# Patient Record
Sex: Male | Born: 1950
Health system: Southern US, Community
[De-identification: ages and names within clinical notes are randomized; demographics above are authoritative.]

## PROBLEM LIST (undated history)

## (undated) DIAGNOSIS — Z89012 Acquired absence of left thumb: Principal | ICD-10-CM

## (undated) DIAGNOSIS — K219 Gastro-esophageal reflux disease without esophagitis: Secondary | ICD-10-CM

## (undated) DIAGNOSIS — H5461 Unqualified visual loss, right eye, normal vision left eye: Secondary | ICD-10-CM

## (undated) DIAGNOSIS — Z87828 Personal history of other (healed) physical injury and trauma: Secondary | ICD-10-CM

## (undated) DIAGNOSIS — J302 Other seasonal allergic rhinitis: Secondary | ICD-10-CM

## (undated) DIAGNOSIS — R32 Unspecified urinary incontinence: Secondary | ICD-10-CM

## (undated) DIAGNOSIS — C801 Malignant (primary) neoplasm, unspecified: Secondary | ICD-10-CM

## (undated) DIAGNOSIS — R233 Spontaneous ecchymoses: Secondary | ICD-10-CM

## (undated) DIAGNOSIS — L570 Actinic keratosis: Secondary | ICD-10-CM

## (undated) DIAGNOSIS — R238 Other skin changes: Secondary | ICD-10-CM

## (undated) DIAGNOSIS — H53451 Other localized visual field defect, right eye: Secondary | ICD-10-CM

## (undated) DIAGNOSIS — E785 Hyperlipidemia, unspecified: Secondary | ICD-10-CM

## (undated) DIAGNOSIS — Z95818 Presence of other cardiac implants and grafts: Secondary | ICD-10-CM

## (undated) DIAGNOSIS — N200 Calculus of kidney: Secondary | ICD-10-CM

## (undated) DIAGNOSIS — I4891 Unspecified atrial fibrillation: Secondary | ICD-10-CM

## (undated) DIAGNOSIS — J181 Lobar pneumonia, unspecified organism: Principal | ICD-10-CM

## (undated) HISTORY — DX: Actinic keratosis: L57.0

## (undated) HISTORY — PX: HERNIA REPAIR: SHX51

## (undated) HISTORY — DX: Lobar pneumonia, unspecified organism: J18.1

## (undated) HISTORY — PX: BRAIN SURGERY: SHX531

## (undated) HISTORY — DX: Other skin changes: R23.8

## (undated) HISTORY — DX: Malignant (primary) neoplasm, unspecified: C80.1

## (undated) HISTORY — DX: Spontaneous ecchymoses: R23.3

## (undated) HISTORY — DX: Acquired absence of left thumb: Z89.012

## (undated) HISTORY — DX: Hyperlipidemia, unspecified: E78.5

## (undated) HISTORY — DX: Presence of other cardiac implants and grafts: Z95.818

## (undated) HISTORY — PX: CARPAL TUNNEL RELEASE: SHX101

## (undated) HISTORY — PX: PROSTATE SURGERY: SHX751

---

## 1998-04-03 ENCOUNTER — Ambulatory Visit (HOSPITAL_BASED_OUTPATIENT_CLINIC_OR_DEPARTMENT_OTHER): Admission: RE | Admit: 1998-04-03 | Discharge: 1998-04-03 | Payer: Self-pay | Admitting: General Surgery

## 1999-10-08 ENCOUNTER — Other Ambulatory Visit: Admission: RE | Admit: 1999-10-08 | Discharge: 1999-10-08 | Payer: Self-pay

## 2000-07-28 ENCOUNTER — Ambulatory Visit (HOSPITAL_BASED_OUTPATIENT_CLINIC_OR_DEPARTMENT_OTHER): Admission: RE | Admit: 2000-07-28 | Discharge: 2000-07-28 | Payer: Self-pay | Admitting: Orthopedic Surgery

## 2001-04-04 ENCOUNTER — Encounter: Payer: Self-pay | Admitting: Urology

## 2001-04-09 ENCOUNTER — Inpatient Hospital Stay (HOSPITAL_COMMUNITY): Admission: RE | Admit: 2001-04-09 | Discharge: 2001-04-12 | Payer: Self-pay | Admitting: Urology

## 2002-03-28 DIAGNOSIS — C801 Malignant (primary) neoplasm, unspecified: Secondary | ICD-10-CM

## 2002-03-28 HISTORY — DX: Malignant (primary) neoplasm, unspecified: C80.1

## 2002-04-11 ENCOUNTER — Ambulatory Visit: Admission: RE | Admit: 2002-04-11 | Discharge: 2002-05-14 | Payer: Self-pay | Admitting: Radiation Oncology

## 2002-07-18 ENCOUNTER — Ambulatory Visit: Admission: RE | Admit: 2002-07-18 | Discharge: 2002-07-18 | Payer: Self-pay | Admitting: Radiation Oncology

## 2002-07-22 ENCOUNTER — Ambulatory Visit: Admission: RE | Admit: 2002-07-22 | Discharge: 2002-09-18 | Payer: Self-pay | Admitting: Radiation Oncology

## 2005-07-27 ENCOUNTER — Ambulatory Visit (HOSPITAL_COMMUNITY): Admission: AD | Admit: 2005-07-27 | Discharge: 2005-07-27 | Payer: Self-pay | Admitting: Urology

## 2006-01-31 ENCOUNTER — Emergency Department: Payer: Self-pay | Admitting: Emergency Medicine

## 2011-04-12 ENCOUNTER — Ambulatory Visit (INDEPENDENT_AMBULATORY_CARE_PROVIDER_SITE_OTHER): Payer: Self-pay | Admitting: General Surgery

## 2011-04-12 ENCOUNTER — Encounter (INDEPENDENT_AMBULATORY_CARE_PROVIDER_SITE_OTHER): Payer: Self-pay | Admitting: General Surgery

## 2011-04-12 VITALS — BP 154/90 | HR 67 | Temp 97.7°F | Ht 67.0 in | Wt 192.2 lb

## 2011-04-12 DIAGNOSIS — K4090 Unilateral inguinal hernia, without obstruction or gangrene, not specified as recurrent: Secondary | ICD-10-CM

## 2011-04-12 NOTE — Progress Notes (Signed)
Patient ID: Michael Cantrell, male   DOB: Feb 06, 1951, 61 y.o.   MRN: [id number]  Chief Complaint  Patient presents with  . Pre-op Exam    eval hernia    HPI Michael Cantrell is a 61 y.o. male.  Self referred HPI 17 yom who presents with right groin bulge and discomfort over last 4 weeks.  He noticed this after doing some lifting.  He works as a Archivist.  He has a prior history of a LIH repair with mesh by Dr. Maryagnes Cantrell.  He has no n/v, issues with bowel movements.  This is worse with standing during the day and always goes away at night.  He comes in today to discuss repair.  Past Medical History  Diagnosis Date  . Hyperlipidemia   . Nasal congestion   . Bruises easily     Past Surgical History  Procedure Date  . Hernia repair     LIH with mesh 2000  . Prostate surgery     CaP treated with prostatectomy and xrt    History reviewed. No pertinent family history.  Social History History  Substance Use Topics  . Smoking status: Never Smoker   . Smokeless tobacco: Not on file  . Alcohol Use: Yes    Allergies  Allergen Reactions  . Ivp Dye (Iodinated Diagnostic Agents) Hives    Current Outpatient Prescriptions  Medication Sig Dispense Refill  . famotidine (PEPCID) 10 MG tablet Take 10 mg by mouth as needed.      [name] ibuprofen (ADVIL,MOTRIN) 200 MG tablet Take 200 mg by mouth as needed.      . Loratadine (CLARITIN PO) Take 10 mg by mouth as needed.        Review of Systems Review of Systems  Constitutional: Negative for fever, chills and unexpected weight change.  HENT: Negative for hearing loss, congestion, sore throat, trouble swallowing and voice change.   Eyes: Negative for visual disturbance.  Respiratory: Negative for cough and wheezing.   Cardiovascular: Negative for chest pain, palpitations and leg swelling.  Gastrointestinal: Negative for nausea, vomiting, abdominal pain, diarrhea, constipation, blood in stool, abdominal distention, anal bleeding and rectal pain.    Genitourinary: Negative for hematuria and difficulty urinating.  Musculoskeletal: Negative for arthralgias.  Skin: Negative for rash and wound.  Neurological: Negative for seizures, syncope, weakness and headaches.  Hematological: Negative for adenopathy. Does not bruise/bleed easily.  Psychiatric/Behavioral: Negative for confusion.    Blood pressure 154/90, pulse 67, temperature 97.7 F (36.5 C), temperature source Temporal, height 5\' 7"  (1.702 m), weight 192 lb 3.2 oz (87.181 kg), SpO2 97.00%.  Physical Exam Physical Exam  Constitutional: He appears well-developed and well-nourished.  Neck: Neck supple.  Cardiovascular: Normal rate, regular rhythm and normal heart sounds.   Pulmonary/Chest: Effort normal and breath sounds normal. He has no wheezes. He has no rales.  Abdominal: Soft. Bowel sounds are normal. He exhibits no mass. There is no tenderness. A hernia is present. Hernia confirmed positive in the right inguinal area. Hernia confirmed negative in the left inguinal area.  Genitourinary: Testes normal and penis normal.     Lymphadenopathy:    He has no cervical adenopathy.       Right: No inguinal adenopathy present.       Left: No inguinal adenopathy present.      Assessment    RIH    Plan    We discussed observation versus repair.  We discussed both laparoscopic and open inguinal hernia repairs. I  described the procedure in detail.  The patient was given educational material.  Goals should be achieved with surgery. We discussed the usage of mesh and the rationale behind that. We went over the pathophysiology of inguinal hernia. We have elected to perform open inguinal hernia repair with mesh.  We discussed the risks including bleeding, infection, recurrence, postoperative pain and chronic groin pain, testicular injury, urinary retention, numbness in groin and around incision.         Michael Cantrell 04/12/2011, 9:35 AM

## 2011-04-12 NOTE — Patient Instructions (Signed)
Educational material provided.

## 2011-04-15 ENCOUNTER — Encounter (HOSPITAL_BASED_OUTPATIENT_CLINIC_OR_DEPARTMENT_OTHER): Payer: Self-pay | Admitting: *Deleted

## 2011-04-15 NOTE — Pre-Procedure Instructions (Signed)
To come for CBC, BMET, EKG, CXR

## 2011-04-19 ENCOUNTER — Encounter (HOSPITAL_BASED_OUTPATIENT_CLINIC_OR_DEPARTMENT_OTHER)
Admission: RE | Admit: 2011-04-19 | Discharge: 2011-04-19 | Disposition: A | Discharge status: Home / Self Care | Payer: Self-pay | Source: Ambulatory Visit | Attending: General Surgery | Admitting: General Surgery

## 2011-04-19 ENCOUNTER — Other Ambulatory Visit: Payer: Self-pay

## 2011-04-19 ENCOUNTER — Ambulatory Visit
Admission: RE | Admit: 2011-04-19 | Discharge: 2011-04-19 | Disposition: A | Discharge status: Home / Self Care | Payer: No Typology Code available for payment source | Source: Ambulatory Visit | Attending: General Surgery | Admitting: General Surgery

## 2011-04-19 LAB — CBC
MCH: 31.6 pg (ref 26.0–34.0)
MCHC: 34.4 g/dL (ref 30.0–36.0)
Platelets: 153 10*3/uL (ref 150–400)
RDW: 13.5 % (ref 11.5–15.5)

## 2011-04-19 LAB — BASIC METABOLIC PANEL
BUN: 15 mg/dL (ref 6–23)
Calcium: 9.5 mg/dL (ref 8.4–10.5)
GFR calc non Af Amer: >90 mL/min (ref >90)
Glucose, Bld: 96 mg/dL (ref 70–99)
Sodium: 139 mEq/L (ref 135–145)

## 2011-04-20 ENCOUNTER — Encounter (HOSPITAL_BASED_OUTPATIENT_CLINIC_OR_DEPARTMENT_OTHER): Payer: Self-pay | Admitting: *Deleted

## 2011-04-20 ENCOUNTER — Ambulatory Visit (HOSPITAL_BASED_OUTPATIENT_CLINIC_OR_DEPARTMENT_OTHER): Payer: Self-pay | Admitting: Anesthesiology

## 2011-04-20 ENCOUNTER — Encounter (HOSPITAL_BASED_OUTPATIENT_CLINIC_OR_DEPARTMENT_OTHER): Payer: Self-pay | Admitting: Anesthesiology

## 2011-04-20 ENCOUNTER — Ambulatory Visit (HOSPITAL_BASED_OUTPATIENT_CLINIC_OR_DEPARTMENT_OTHER)
Admission: RE | Admit: 2011-04-20 | Discharge: 2011-04-20 | Disposition: A | Discharge status: Home / Self Care | Payer: Self-pay | Source: Ambulatory Visit | Attending: General Surgery | Admitting: General Surgery

## 2011-04-20 ENCOUNTER — Encounter (HOSPITAL_BASED_OUTPATIENT_CLINIC_OR_DEPARTMENT_OTHER)
Admission: RE | Disposition: A | Discharge status: Home / Self Care | Payer: Self-pay | Source: Ambulatory Visit | Attending: General Surgery

## 2011-04-20 DIAGNOSIS — K4090 Unilateral inguinal hernia, without obstruction or gangrene, not specified as recurrent: Secondary | ICD-10-CM | POA: Insufficient documentation

## 2011-04-20 HISTORY — DX: Gastro-esophageal reflux disease without esophagitis: K21.9

## 2011-04-20 HISTORY — DX: Unspecified urinary incontinence: R32

## 2011-04-20 HISTORY — DX: Unqualified visual loss, right eye, normal vision left eye: H54.61

## 2011-04-20 HISTORY — DX: Other seasonal allergic rhinitis: J30.2

## 2011-04-20 HISTORY — DX: Calculus of kidney: N20.0

## 2011-04-20 HISTORY — DX: Personal history of other (healed) physical injury and trauma: Z87.828

## 2011-04-20 HISTORY — PX: INGUINAL HERNIA REPAIR: SHX194

## 2011-04-20 SURGERY — REPAIR, HERNIA, INGUINAL, ADULT
Anesthesia: General | Laterality: Right | Wound class: Clean

## 2011-04-20 MED ORDER — BUPIVACAINE HCL (PF) 0.25 % IJ SOLN
INTRAMUSCULAR | Status: DC | PRN
  Administered 2011-04-20: 5 mL

## 2011-04-20 MED ORDER — OXYCODONE-ACETAMINOPHEN 5-325 MG PO TABS: 1.0000 | ORAL_TABLET | Freq: Four times a day (QID) | ORAL | Status: AC | PRN

## 2011-04-20 MED ORDER — FENTANYL CITRATE 0.05 MG/ML IJ SOLN
50.0000 ug | INTRAMUSCULAR | Status: DC | PRN
  Administered 2011-04-20: 100 ug via INTRAVENOUS

## 2011-04-20 MED ORDER — LIDOCAINE HCL (PF) 1 % IJ SOLN
INTRAMUSCULAR | Status: DC | PRN
  Administered 2011-04-20: 2 mL

## 2011-04-20 MED ORDER — MIDAZOLAM HCL 2 MG/2ML IJ SOLN
0.5000 mg | INTRAMUSCULAR | Status: DC | PRN
  Administered 2011-04-20: 2 mg via INTRAVENOUS

## 2011-04-20 MED ORDER — FENTANYL CITRATE 0.05 MG/ML IJ SOLN: 25.0000 ug | INTRAMUSCULAR | Status: DC | PRN

## 2011-04-20 MED ORDER — METOCLOPRAMIDE HCL 5 MG/ML IJ SOLN: 10.0000 mg | Freq: Once | INTRAMUSCULAR | Status: DC | PRN

## 2011-04-20 MED ORDER — BUPIVACAINE-EPINEPHRINE PF 0.5-1:200000 % IJ SOLN
INTRAMUSCULAR | Status: DC | PRN
  Administered 2011-04-20: 25 mL

## 2011-04-20 MED ORDER — DROPERIDOL 2.5 MG/ML IJ SOLN
INTRAMUSCULAR | Status: DC | PRN
  Administered 2011-04-20: 0.625 mg via INTRAVENOUS

## 2011-04-20 MED ORDER — MORPHINE SULFATE 10 MG/ML IJ SOLN: 0.0500 mg/kg | INTRAMUSCULAR | Status: DC | PRN

## 2011-04-20 MED ORDER — PROPOFOL 10 MG/ML IV EMUL
INTRAVENOUS | Status: DC | PRN
  Administered 2011-04-20: 200 mg via INTRAVENOUS

## 2011-04-20 MED ORDER — OXYCODONE HCL 5 MG PO TABS
5.0000 mg | ORAL_TABLET | Freq: Once | ORAL | Status: AC
  Administered 2011-04-20: 5 mg via ORAL

## 2011-04-20 MED ORDER — DEXAMETHASONE SODIUM PHOSPHATE 4 MG/ML IJ SOLN
INTRAMUSCULAR | Status: DC | PRN
  Administered 2011-04-20: 10 mg via INTRAVENOUS

## 2011-04-20 MED ORDER — ACETAMINOPHEN 10 MG/ML IV SOLN
1000.0000 mg | Freq: Once | INTRAVENOUS | Status: AC
  Administered 2011-04-20: 1000 mg via INTRAVENOUS

## 2011-04-20 MED ORDER — CEFAZOLIN SODIUM-DEXTROSE 2-3 GM-% IV SOLR
2.0000 g | INTRAVENOUS | Status: AC
  Administered 2011-04-20: 2 g via INTRAVENOUS

## 2011-04-20 MED ORDER — HYDROMORPHONE HCL PF 1 MG/ML IJ SOLN
0.5000 mg | INTRAMUSCULAR | Status: DC | PRN
  Administered 2011-04-20 (×2): 0.25 mg via INTRAVENOUS

## 2011-04-20 MED ORDER — LACTATED RINGERS IV SOLN
INTRAVENOUS | Status: DC
  Administered 2011-04-20 (×3): via INTRAVENOUS

## 2011-04-20 MED ORDER — FENTANYL CITRATE 0.05 MG/ML IJ SOLN
INTRAMUSCULAR | Status: DC | PRN
  Administered 2011-04-20: 50 ug via INTRAVENOUS
  Administered 2011-04-20 (×2): 25 ug via INTRAVENOUS
  Administered 2011-04-20: 50 ug via INTRAVENOUS

## 2011-04-20 MED ORDER — ONDANSETRON HCL 4 MG/2ML IJ SOLN
INTRAMUSCULAR | Status: DC | PRN
  Administered 2011-04-20: 4 mg via INTRAVENOUS

## 2011-04-20 SURGICAL SUPPLY — 49 items
ADH SKN CLS APL DERMABOND .7 (GAUZE/BANDAGES/DRESSINGS) ×1
BLADE SURG 15 STRL LF DISP TIS (BLADE) ×1 IMPLANT
BLADE SURG 15 STRL SS (BLADE) ×1
BLADE SURG ROTATE 9660 (MISCELLANEOUS) IMPLANT
CHLORAPREP W/TINT 26ML (MISCELLANEOUS) ×2 IMPLANT
CLOTH BEACON ORANGE TIMEOUT ST (SAFETY) ×2 IMPLANT
COVER MAYO STAND STRL (DRAPES) ×2 IMPLANT
COVER TABLE BACK 60X90 (DRAPES) ×2 IMPLANT
DECANTER SPIKE VIAL GLASS SM (MISCELLANEOUS) IMPLANT
DERMABOND ADVANCED (GAUZE/BANDAGES/DRESSINGS) ×1
DERMABOND ADVANCED .7 DNX12 (GAUZE/BANDAGES/DRESSINGS) ×1 IMPLANT
DRAIN PENROSE 1/2X12 LTX STRL (WOUND CARE) ×2 IMPLANT
DRAPE PED LAPAROTOMY (DRAPES) ×2 IMPLANT
ELECT COATED BLADE 2.86 ST (ELECTRODE) ×2 IMPLANT
ELECT REM PT RETURN 9FT ADLT (ELECTROSURGICAL) ×2
ELECTRODE REM PT RTRN 9FT ADLT (ELECTROSURGICAL) ×1 IMPLANT
GLOVE BIO SURGEON STRL SZ 6.5 (GLOVE) ×2 IMPLANT
GLOVE BIO SURGEON STRL SZ7 (GLOVE) ×4 IMPLANT
GLOVE BIOGEL M STRL SZ7.5 (GLOVE) ×2 IMPLANT
GLOVE BIOGEL PI IND STRL 7.0 (GLOVE) ×1 IMPLANT
GLOVE BIOGEL PI IND STRL 7.5 (GLOVE) ×1 IMPLANT
GLOVE BIOGEL PI IND STRL 8 (GLOVE) ×1 IMPLANT
GLOVE BIOGEL PI INDICATOR 7.0 (GLOVE) ×1
GLOVE BIOGEL PI INDICATOR 7.5 (GLOVE) ×1
GLOVE BIOGEL PI INDICATOR 8 (GLOVE) ×1
GOWN PREVENTION PLUS XLARGE (GOWN DISPOSABLE) ×4 IMPLANT
GOWN PREVENTION PLUS XXLARGE (GOWN DISPOSABLE) ×2 IMPLANT
MESH ULTRAPRO 3X6 7.6X15CM (Mesh General) ×2 IMPLANT
NEEDLE HYPO 22GX1.5 SAFETY (NEEDLE) ×2 IMPLANT
NS IRRIG 1000ML POUR BTL (IV SOLUTION) IMPLANT
PACK BASIN DAY SURGERY FS (CUSTOM PROCEDURE TRAY) ×2 IMPLANT
PENCIL BUTTON HOLSTER BLD 10FT (ELECTRODE) ×2 IMPLANT
SLEEVE SCD COMPRESS KNEE MED (MISCELLANEOUS) ×2 IMPLANT
SPONGE LAP 4X18 X RAY DECT (DISPOSABLE) ×2 IMPLANT
STRIP CLOSURE SKIN 1/2X4 (GAUZE/BANDAGES/DRESSINGS) IMPLANT
SUT MNCRL AB 4-0 PS2 18 (SUTURE) ×2 IMPLANT
SUT PROLENE 2 0 CT2 30 (SUTURE) ×6 IMPLANT
SUT PROLENE 2 0 SH DA (SUTURE) ×2 IMPLANT
SUT SILK 2 0 SH (SUTURE) IMPLANT
SUT VIC AB 0 SH 27 (SUTURE) IMPLANT
SUT VIC AB 2-0 SH 27 (SUTURE) ×4
SUT VIC AB 2-0 SH 27XBRD (SUTURE) ×2 IMPLANT
SUT VIC AB 3-0 SH 27 (SUTURE) ×2
SUT VIC AB 3-0 SH 27X BRD (SUTURE) ×1 IMPLANT
SUT VICRYL AB 3 0 TIES (SUTURE) ×4 IMPLANT
SYR CONTROL 10ML LL (SYRINGE) ×2 IMPLANT
TOWEL OR 17X24 6PK STRL BLUE (TOWEL DISPOSABLE) ×2 IMPLANT
TOWEL OR NON WOVEN STRL DISP B (DISPOSABLE) ×2 IMPLANT
WATER STERILE IRR 1000ML POUR (IV SOLUTION) IMPLANT

## 2011-04-20 NOTE — H&P (View-Only) (Signed)
Patient ID: Michael Cantrell, male   DOB: 08/13/1950, 61 y.o.   MRN: [id number]  Chief Complaint  Patient presents with  . Pre-op Exam    eval hernia    HPI Michael Cantrell is a 61 y.o. male.  Self referred HPI 60 yom who presents with right groin bulge and discomfort over last 4 weeks.  He noticed this after doing some lifting.  He works as a cabinet maker.  He has a prior history of a LIH repair with mesh by Dr. Leone.  He has no n/v, issues with bowel movements.  This is worse with standing during the day and always goes away at night.  He comes in today to discuss repair.  Past Medical History  Diagnosis Date  . Hyperlipidemia   . Nasal congestion   . Bruises easily     Past Surgical History  Procedure Date  . Hernia repair     LIH with mesh 2000  . Prostate surgery     CaP treated with prostatectomy and xrt    History reviewed. No pertinent family history.  Social History History  Substance Use Topics  . Smoking status: Never Smoker   . Smokeless tobacco: Not on file  . Alcohol Use: Yes    Allergies  Allergen Reactions  . Ivp Dye (Iodinated Diagnostic Agents) Hives    Current Outpatient Prescriptions  Medication Sig Dispense Refill  . famotidine (PEPCID) 10 MG tablet Take 10 mg by mouth as needed.      . ibuprofen (ADVIL,MOTRIN) 200 MG tablet Take 200 mg by mouth as needed.      . Loratadine (CLARITIN PO) Take 10 mg by mouth as needed.        Review of Systems Review of Systems  Constitutional: Negative for fever, chills and unexpected weight change.  HENT: Negative for hearing loss, congestion, sore throat, trouble swallowing and voice change.   Eyes: Negative for visual disturbance.  Respiratory: Negative for cough and wheezing.   Cardiovascular: Negative for chest pain, palpitations and leg swelling.  Gastrointestinal: Negative for nausea, vomiting, abdominal pain, diarrhea, constipation, blood in stool, abdominal distention, anal bleeding and rectal pain.    Genitourinary: Negative for hematuria and difficulty urinating.  Musculoskeletal: Negative for arthralgias.  Skin: Negative for rash and wound.  Neurological: Negative for seizures, syncope, weakness and headaches.  Hematological: Negative for adenopathy. Does not bruise/bleed easily.  Psychiatric/Behavioral: Negative for confusion.    Blood pressure 154/90, pulse 67, temperature 97.7 F (36.5 C), temperature source Temporal, height 5' 7" (1.702 m), weight 192 lb 3.2 oz (87.181 kg), SpO2 97.00%.  Physical Exam Physical Exam  Constitutional: He appears well-developed and well-nourished.  Neck: Neck supple.  Cardiovascular: Normal rate, regular rhythm and normal heart sounds.   Pulmonary/Chest: Effort normal and breath sounds normal. He has no wheezes. He has no rales.  Abdominal: Soft. Bowel sounds are normal. He exhibits no mass. There is no tenderness. A hernia is present. Hernia confirmed positive in the right inguinal area. Hernia confirmed negative in the left inguinal area.  Genitourinary: Testes normal and penis normal.     Lymphadenopathy:    He has no cervical adenopathy.       Right: No inguinal adenopathy present.       Left: No inguinal adenopathy present.      Assessment    RIH    Plan    We discussed observation versus repair.  We discussed both laparoscopic and open inguinal hernia repairs. I   described the procedure in detail.  The patient was given educational material.  Goals should be achieved with surgery. We discussed the usage of mesh and the rationale behind that. We went over the pathophysiology of inguinal hernia. We have elected to perform open inguinal hernia repair with mesh.  We discussed the risks including bleeding, infection, recurrence, postoperative pain and chronic groin pain, testicular injury, urinary retention, numbness in groin and around incision.         Mashayla Lavin 04/12/2011, 9:35 AM    

## 2011-04-20 NOTE — Anesthesia Preprocedure Evaluation (Signed)
Anesthesia Evaluation  Patient identified by MRN, date of birth, ID band Patient awake    Reviewed: Allergy & Precautions, H&P , NPO status , Patient's Chart, lab work & pertinent test results, reviewed documented beta blocker date and time   Airway Mallampati: II TM Distance: >3 FB Neck ROM: full    Dental   Pulmonary neg pulmonary ROS,          Cardiovascular neg cardio ROS     Neuro/Psych Negative Neurological ROS  Negative Psych ROS   GI/Hepatic negative GI ROS, Neg liver ROS,   Endo/Other  Negative Endocrine ROS  Renal/GU negative Renal ROS  Genitourinary negative   Musculoskeletal   Abdominal   Peds  Hematology negative hematology ROS (+)   Anesthesia Other Findings See surgeon's H&P   Reproductive/Obstetrics negative OB ROS                           Anesthesia Physical Anesthesia Plan  ASA: II  Anesthesia Plan: General   Post-op Pain Management: MAC Combined w/ Regional for Post-op pain   Induction: Intravenous  Airway Management Planned: LMA  Additional Equipment:   Intra-op Plan:   Post-operative Plan: Extubation in OR  Informed Consent: I have reviewed the patients History and Physical, chart, labs and discussed the procedure including the risks, benefits and alternatives for the proposed anesthesia with the patient or authorized representative who has indicated his/her understanding and acceptance.     Plan Discussed with: CRNA and Surgeon  Anesthesia Plan Comments:         Anesthesia Quick Evaluation  

## 2011-04-20 NOTE — Anesthesia Postprocedure Evaluation (Signed)
Anesthesia Post Note  Patient: Michael Cantrell  Procedure(s) Performed:  HERNIA REPAIR INGUINAL ADULT - right inguinal hernia with mesh   Anesthesia type: General  Patient location: PACU  Post pain: Pain level controlled  Post assessment: Patient's Cardiovascular Status Stable  Last Vitals:  Filed Vitals:   04/20/11 1701  BP:   Pulse: 67  Temp:   Resp: 17    Post vital signs: Reviewed and stable  Level of consciousness: alert  Complications: No apparent anesthesia complications

## 2011-04-20 NOTE — Transfer of Care (Signed)
Immediate Anesthesia Transfer of Care Note  Patient: Michael Cantrell  Procedure(s) Performed:  HERNIA REPAIR INGUINAL ADULT - right inguinal hernia with mesh   Patient Location: PACU  Anesthesia Type: General  Level of Consciousness: sedated  Airway & Oxygen Therapy: Patient Spontanous Breathing and Patient connected to face mask oxygen  Post-op Assessment: Report given to PACU RN and Post -op Vital signs reviewed and stable  Post vital signs: Reviewed and stable  Complications: No apparent anesthesia complications

## 2011-04-20 NOTE — Op Note (Signed)
Preoperative diagnosis: Right inguinal hernia Postoperative diagnosis: Right indirect inguinal hernia Surgeon: Dr. Harden Mo  Procedure: Right inguinal hernia repair with Ultra Pro mesh patch Specimens: None Drains: None Estimated blood loss: Minimal Anesthesia Gen. With LMA and TAP block Sponge needle count correct x2 in the operation Disposition the patient to recovery room in stable condition  Indications: This is a 61 year old male has a prior history of a left groin hernia repair as well as a prostatectomy. He recently after doing some lifting noted a right groin bulge. I saw him in the office we diagnosed with a right inguinal hernia. I discussed open right inguinal hernia repair with mesh. We discussed the risks and benefits associated with the operation.  Procedure: After informed consent the patient first underwent a TAP block with Dr. Gelene Mink. He was then administered 1 g of intravenous cefazolin. Sequential compression devices were placed on his lower extremities prior to induction of anesthesia. He was in place under general anesthesia with an LMA. His right groin was prepped and draped in the standard sterile surgical fashion.a surgical timeout was then performed.  I infiltrated .25% percent Marcaine underneath the skin prior to beginning. I made a right groin incision that was similar to his prior left groin incision. I carried this out down to the superficial epigastric vein. This was fairly large and I ligated this with 2-0 Vicryl sutures and divided it. I then carried this out to his external oblique. I entered this sharply through his external ring. He had a fairly large indirect hernia. He did have a very small direct hernia medially towards where they would've done his prostatectomy. I reduced this and I oversewed this with 2-0 Vicryl suture. This would be covered by my mesh when I placed my patch. This was about a 3 mm hole. I then excised a cord lipoma. I eventually was  able to isolate the indirect inguinal hernia sac. I eventually opened this and there were no contents. I then oversewed this with 3-0 Vicryl. This was then excised. The remnant of the cord lipoma and hernia sac were then placed back into the abdomen. I then placed an Ultra Pro mesh patch over his entire groin. This gave good coverage. I sutured this into numerous positions at the pubic tubercle and inguinal ligament inferiorly. I made a cut around the spermatic cord and attached the mesh again together as well as to the inguinal ligament. This completely obliterated the defect. The mesh was in good position. Hemostasis was observed. I then closed this with 2-0 Vicryl for the external oblique, 3-0 Vicryl for Scarpa's fascia, and 4 Monocryl for the skin. Dermabond was placed over that wound. He tolerated this well was extubated and transferred to recovery room in stable condition.

## 2011-04-20 NOTE — Anesthesia Procedure Notes (Addendum)
Anesthesia Regional Block:  TAP block  Pre-Anesthetic Checklist: ,, timeout performed, Correct Patient, Correct Site, Correct Laterality, Correct Procedure, Correct Position, site marked, Risks and benefits discussed,  Surgical consent,  Pre-op evaluation,  At surgeon's request and post-op pain management  Laterality: Right  Prep: chloraprep       Needles:  Injection technique: Single-shot  Needle Type: Echogenic Needle     Needle Length: 9cm  Needle Gauge: 21    Additional Needles:  Procedures: ultrasound guided TAP block Narrative:  Start time: 04/20/2011 2:17 PM End time: 04/20/2011 2:27 PM Injection made incrementally with aspirations every 5 mL.  Performed by: Personally  Anesthesiologist: Aldona Lento, MD  Additional Notes: Ultrasound guidance used to: id relevant anatomy, confirm needle position, local anesthetic spread, avoidance of vascular puncture. Picture saved. No complications. Block performed personally by Janetta Hora. Gelene Mink, MD    TAP block Procedure Name: LMA Insertion Date/Time: 04/20/2011 3:05 PM Performed by: Zenia Resides D Pre-anesthesia Checklist: Patient identified, Emergency Drugs available, Suction available and Patient being monitored Patient Re-evaluated:Patient Re-evaluated prior to inductionOxygen Delivery Method: Circle System Utilized Preoxygenation: Pre-oxygenation with 100% oxygen Intubation Type: IV induction Ventilation: Mask ventilation without difficulty LMA: LMA inserted LMA Size: 5.0 Number of attempts: 2 Airway Equipment and Method: bite block Placement Confirmation: positive ETCO2 Tube secured with: Tape Dental Injury: Teeth and Oropharynx as per pre-operative assessment

## 2011-04-20 NOTE — Progress Notes (Signed)
Assisted Dr. Frederick with right, ultrasound guided, transabdominal plane block. Side rails up, monitors on throughout procedure. See vital signs in flow sheet. Tolerated Procedure well. 

## 2011-04-20 NOTE — Interval H&P Note (Signed)
History and Physical Interval Note:  04/20/2011 2:44 PM  Michael Cantrell  has presented today for surgery, with the diagnosis of right inguinal hernia   The various methods of treatment have been discussed with the patient and family. After consideration of risks, benefits and other options for treatment, the patient has consented to  Procedure(s): HERNIA REPAIR INGUINAL ADULT as a surgical intervention .  The patients' history has been reviewed, patient examined, no change in status, stable for surgery.  I have reviewed the patients' chart and labs.  Questions were answered to the patient's satisfaction.     Ethleen Lormand

## 2011-04-21 ENCOUNTER — Encounter (HOSPITAL_BASED_OUTPATIENT_CLINIC_OR_DEPARTMENT_OTHER): Payer: Self-pay | Admitting: General Surgery

## 2011-05-23 ENCOUNTER — Encounter (INDEPENDENT_AMBULATORY_CARE_PROVIDER_SITE_OTHER): Payer: Self-pay | Admitting: General Surgery

## 2011-05-23 ENCOUNTER — Ambulatory Visit (INDEPENDENT_AMBULATORY_CARE_PROVIDER_SITE_OTHER): Payer: Self-pay | Admitting: General Surgery

## 2011-05-23 VITALS — BP 162/96 | HR 60 | Temp 97.1°F | Resp 18 | Ht 67.0 in | Wt 192.4 lb

## 2011-05-23 DIAGNOSIS — Z09 Encounter for follow-up examination after completed treatment for conditions other than malignant neoplasm: Secondary | ICD-10-CM

## 2011-05-23 NOTE — Progress Notes (Signed)
Subjective:     Patient ID: Michael Cantrell, male   DOB: [birth year], 61 y.o.   MRN: [id number]  HPI This is a 61 year old male who I did an open right inguinal repair with mesh about a month ago now. He returns today without any complaints doing very well. He is back to most of his normal activity.  Review of Systems     Objective:   Physical Exam Well healed right groin incision without infection    Assessment:     S/p RIH    Plan:     Normal postop course.  May return to full activity. Needs to get bp check by primary physician

## 2011-05-23 NOTE — Patient Instructions (Signed)
Return to normal activity 

## 2011-05-30 ENCOUNTER — Telehealth (INDEPENDENT_AMBULATORY_CARE_PROVIDER_SITE_OTHER): Payer: Self-pay

## 2011-05-30 DIAGNOSIS — R9389 Abnormal findings on diagnostic imaging of other specified body structures: Secondary | ICD-10-CM

## 2011-05-30 NOTE — Telephone Encounter (Signed)
Called pt back to inform him of his xray appt and his wife took the message. I made him an appt with [facility] for 06-03-11 arrive at 10:00 for 10:20. The pt is upset right now about having the xray so the wife said he was going to think about having it done b/c he has no insurance. They will let me know if I need to cancel the appt. The wife wanted me to read the report to her again b/c she has a friend that does CT scans and she will go over the report with her.

## 2011-05-30 NOTE — Telephone Encounter (Signed)
LMOM for pt to call me back so I can discuss about the preop CXR that is requesting for pt to go get a Chest Ct.

## 2011-05-30 NOTE — Telephone Encounter (Signed)
Pt returned my call. I explained that when pt was in for his last appt  That Dr Dwain Sarna was going to go over the results with him but it got missed. I advised the pt that Radiology was recommending the pt get a Chest Ct. The pt wants to know how much it would cost. I advised the pt that I would call [facility] and find out the cost. I will call pt back with info.

## 2011-06-02 ENCOUNTER — Telehealth (INDEPENDENT_AMBULATORY_CARE_PROVIDER_SITE_OTHER): Payer: Self-pay

## 2011-06-02 NOTE — Telephone Encounter (Signed)
Pt called to request to cancel the Chest CT for 06-03-11 but I explained to him that Dr Dwain Sarna really recommends the pt to get the scan since that was suggested on the abnormal cxr. We can't make the pt go get the scan but Dr Dwain Sarna really does recommend the pt getting the scan. The pt asked for me to r/s the scan to the end of the month. I r/s the scan with [facility] for 06-23-11 arrive at 9:50am for 10:10am.

## 2011-06-03 ENCOUNTER — Other Ambulatory Visit: Payer: No Typology Code available for payment source

## 2011-06-23 ENCOUNTER — Ambulatory Visit
Admission: RE | Admit: 2011-06-23 | Discharge: 2011-06-23 | Disposition: A | Discharge status: Home / Self Care | Payer: No Typology Code available for payment source | Source: Ambulatory Visit | Attending: General Surgery | Admitting: General Surgery

## 2011-06-23 DIAGNOSIS — R9389 Abnormal findings on diagnostic imaging of other specified body structures: Secondary | ICD-10-CM

## 2011-06-28 ENCOUNTER — Telehealth (INDEPENDENT_AMBULATORY_CARE_PROVIDER_SITE_OTHER): Payer: Self-pay

## 2011-06-28 NOTE — Telephone Encounter (Signed)
Pt calling requesting results of his Chest Ct scan done last week. I advised pt that Dr Dwain Sarna has not reviewed report yet b/c he is out of the office but I did review the report stating no acute process in the chest. I advised pt that Dr Dwain Sarna will look over report when he returns and if there is anything to add on the report I will call him back. The pt understands and is ok with this info.

## 2012-10-08 ENCOUNTER — Emergency Department: Payer: Self-pay | Admitting: Emergency Medicine

## 2012-10-08 LAB — COMPREHENSIVE METABOLIC PANEL
Albumin: 3.8 g/dL (ref 3.4–5.0)
Anion Gap: 7 (ref 7–16)
Chloride: 107 mmol/L (ref 98–107)
Co2: 28 mmol/L (ref 21–32)
Creatinine: 0.91 mg/dL (ref 0.60–1.30)
EGFR (African American): >60
Glucose: 98 mg/dL (ref 65–99)
Osmolality: 285 (ref 275–301)
SGPT (ALT): 26 U/L (ref 12–78)

## 2012-10-08 LAB — CBC
HCT: 49.9 % (ref 40.0–52.0)
MCH: 31 pg (ref 26.0–34.0)
Platelet: 196 10*3/uL (ref 150–440)
RBC: 5.35 10*6/uL (ref 4.40–5.90)
RDW: 14.2 % (ref 11.5–14.5)
WBC: 9.7 10*3/uL (ref 3.8–10.6)

## 2013-06-16 ENCOUNTER — Emergency Department: Payer: Self-pay | Admitting: Emergency Medicine

## 2013-06-16 LAB — BASIC METABOLIC PANEL
ANION GAP: 7 (ref 7–16)
BUN: 21 mg/dL — ABNORMAL HIGH (ref 7–18)
CO2: 27 mmol/L (ref 21–32)
CREATININE: 0.9 mg/dL (ref 0.60–1.30)
Calcium, Total: 8.8 mg/dL (ref 8.5–10.1)
Chloride: 102 mmol/L (ref 98–107)
EGFR (Non-African Amer.): >60
Glucose: 90 mg/dL (ref 65–99)
Osmolality: 274 (ref 275–301)
POTASSIUM: 3.3 mmol/L — AB (ref 3.5–5.1)
SODIUM: 136 mmol/L (ref 136–145)

## 2013-06-16 LAB — CBC
HCT: 50.7 % (ref 40.0–52.0)
HGB: 17 g/dL (ref 13.0–18.0)
MCH: 31.3 pg (ref 26.0–34.0)
MCHC: 33.4 g/dL (ref 32.0–36.0)
MCV: 94 fL (ref 80–100)
Platelet: 175 10*3/uL (ref 150–440)
RBC: 5.42 10*6/uL (ref 4.40–5.90)
RDW: 14.3 % (ref 11.5–14.5)
WBC: 9.8 10*3/uL (ref 3.8–10.6)

## 2013-06-16 LAB — PROTIME-INR
INR: 0.9
PROTHROMBIN TIME: 12.5 s (ref 11.5–14.7)

## 2013-06-16 LAB — TROPONIN I

## 2013-08-06 ENCOUNTER — Ambulatory Visit: Payer: Self-pay | Admitting: Family Medicine

## 2013-11-04 ENCOUNTER — Other Ambulatory Visit: Payer: Self-pay | Admitting: Urology

## 2013-11-04 DIAGNOSIS — C61 Malignant neoplasm of prostate: Secondary | ICD-10-CM

## 2013-11-08 ENCOUNTER — Encounter (HOSPITAL_COMMUNITY): Payer: Self-pay

## 2013-11-08 ENCOUNTER — Encounter (HOSPITAL_COMMUNITY)
Admission: RE | Admit: 2013-11-08 | Discharge: 2013-11-08 | Disposition: A | Discharge status: Home / Self Care | Payer: Self-pay | Source: Ambulatory Visit | Attending: Diagnostic Radiology | Admitting: Diagnostic Radiology

## 2013-11-08 DIAGNOSIS — C61 Malignant neoplasm of prostate: Secondary | ICD-10-CM | POA: Insufficient documentation

## 2013-11-08 MED ORDER — TECHNETIUM TC 99M MEDRONATE IV KIT
26.0000 | PACK | Freq: Once | INTRAVENOUS | Status: AC | PRN
  Administered 2013-11-08: 26 via INTRAVENOUS

## 2014-01-09 HISTORY — PX: ROTATOR CUFF REPAIR: SHX139

## 2014-05-20 NOTE — Progress Notes (Addendum)
Histology and Location of Primary Cancer: prostate cancer - referral is to have treatment of breast tissue prior to initiation of androgen.  SAFETY ISSUES:  Prior radiation? Yes - prostate which was completed 09/13/2002   Pacemaker/ICD? no  Possible current pregnancy? no  Is the patient on methotrexate? no  Additional Complaints / other details:  He had a prostatectomy in 2004.  Reports PSA is up to 18.39 on 04/07/14 and now needs androgen therapy.  He had rotator cuff surgery in October and has some soreness in his left shoulder.  BP 124/84 mmHg  Pulse 95  Temp(Src) 97.7 F (36.5 C) (Oral)  Resp 16  Ht 5\' 7"  (1.702 m)  Wt 195 lb 3.2 oz (88.542 kg)  BMI 30.57 kg/m2

## 2014-05-22 ENCOUNTER — Encounter: Payer: Self-pay | Admitting: Radiation Oncology

## 2014-05-22 ENCOUNTER — Ambulatory Visit
Admission: RE | Admit: 2014-05-22 | Discharge: 2014-05-22 | Disposition: A | Discharge status: Home / Self Care | Payer: Self-pay | Source: Ambulatory Visit | Attending: Radiation Oncology | Admitting: Radiation Oncology

## 2014-05-22 VITALS — BP 124/84 | HR 95 | Temp 97.7°F | Resp 16 | Ht 67.0 in | Wt 195.2 lb

## 2014-05-22 DIAGNOSIS — C61 Malignant neoplasm of prostate: Secondary | ICD-10-CM

## 2014-05-22 NOTE — Progress Notes (Signed)
Please see the Nurse Progress Note in the MD Initial Consult Encounter for this patient. 

## 2014-05-22 NOTE — Progress Notes (Signed)
Radiation Oncology         [phone] ________________________________  Name: Michael Cantrell MRN: [id number]  Date: 05/22/2014  DOB: [birth year]  Re-evaluation Note  CC: LADA, Satira Anis, MD  Dahlstedt, Lillette Boxer, MD    ICD-9-CM ICD-10-CM   1. Prostate cancer 185 C61     Diagnosis:   Recurrent prostate cancer  Interval Since Last Radiation:  12 years, the patient was treated for PSA recurrent prostate cancer at that time by Dr. Arloa Koh. The patient received 63 Gy directed at the prostate bed region.  Narrative:  The patient returns today at the courtesy of Dr. Diona Fanti for consideration of prophylactic breast radiation therapy as part of his overall management. After the patient's salvage radiation therapy his PSA remained the low for some time with last PSA in August 2007 being 0.85. Due to financial issues the patient did not continue regular follow-up with urology. Patient was recently seen by his primary care physician and a PSA was performed which showed significant elevation to 14. Patient was seen by urology and the PSA on 04/04/2014 showed further elevation to 18.39. Patient did undergo a CT scan of abdomen and pelvis for pelvic pain showing no obvious recurrence within the pelvis or abdominal area. A bone scan recently showed no obvious osseous metastasis. Patient is being evaluated for androgen ablation is now seen in radiation oncology for radiation therapy directed at the breast area to prevent gynecomastia.                              ALLERGIES:  is allergic to ivp dye.  Meds: Current Outpatient Prescriptions  Medication Sig Dispense Refill  . aspirin 325 MG tablet Take 325 mg by mouth daily.    [name] diltiazem (CARDIZEM CD) 240 MG 24 hr capsule Take 300 mg by mouth.     . famotidine (PEPCID) 10 MG tablet Take 10 mg by mouth as needed. 3 TIMES/WEEK    . hydrochlorothiazide (HYDRODIURIL) 25 MG tablet Take 25 mg by mouth daily.    [name] ibuprofen (ADVIL,MOTRIN) 200 MG  tablet Take 200 mg by mouth as needed.    . latanoprost (XALATAN) 0.005 % ophthalmic solution 1 drop at bedtime.    . Loratadine (CLARITIN PO) Take 10 mg by mouth as needed.    [name] losartan (COZAAR) 100 MG tablet Take by mouth.     No current facility-administered medications for this encounter.    Physical Findings: The patient is in no acute distress. Patient is alert and oriented.  height is 5\' 7"  (1.702 m) and weight is 195 lb 3.2 oz (88.542 kg). His oral temperature is 97.7 F (36.5 C). His blood pressure is 124/84 and his pulse is 95. His respiration is 16. .  The lungs are clear. The heart has a regular rhythm and rate. Examination of the breast area reveals no palpable mass or nipple discharge or bleeding.  Lab Findings: Lab Results  Component Value Date   WBC 7.0 04/19/2011   HGB 16.8 04/19/2011   HCT 48.9 04/19/2011   MCV 92.1 04/19/2011   PLT 153 04/19/2011    Radiographic Findings: No results found.  Impression: The patient would be a good candidate for prophylactic radiation directed at the breast area. I discussed the treatment course side effects and potential toxicities of radiation therapy with the patient and his wife. He appears to understand and wishes to proceed with planned course of treatment.  Plan:  Set up and first treatment on March 1. The patient will receive 3 consecutive treatments for a dose of 12 Gy.  ____________________________________ Blair Promise, MD

## 2014-05-26 NOTE — Addendum Note (Signed)
Encounter addended by: Jacqulyn Liner, RN on: 05/26/2014  9:44 AM<BR>     Documentation filed: Charges VN

## 2014-05-27 ENCOUNTER — Ambulatory Visit
Admission: RE | Admit: 2014-05-27 | Discharge: 2014-05-27 | Disposition: A | Discharge status: Home / Self Care | Payer: Self-pay | Source: Ambulatory Visit | Attending: Radiation Oncology | Admitting: Radiation Oncology

## 2014-05-27 ENCOUNTER — Ambulatory Visit: Payer: Self-pay | Admitting: Radiation Oncology

## 2014-05-27 ENCOUNTER — Ambulatory Visit: Admission: RE | Admit: 2014-05-27 | Payer: Self-pay | Source: Ambulatory Visit | Admitting: Radiation Oncology

## 2014-05-27 DIAGNOSIS — C61 Malignant neoplasm of prostate: Secondary | ICD-10-CM

## 2014-05-27 NOTE — Progress Notes (Signed)
  Radiation Oncology         [phone] ________________________________  Name: Michael Cantrell MRN: [id number]  Date: 05/27/2014  DOB: Sep 19, 1950  Simulation Verification Note    ICD-9-CM ICD-10-CM   1. Prostate cancer 185 C61     Status: outpatient  NARRATIVE: The patient was brought to the treatment unit and placed in the planned treatment position. The clinical setup was verified. tHe patient's electron setup directed at the left and right breast area was verified and deemed accurate for treatment.  The treatment beams were carefully compared against the planned radiation fields. The position location and shape of the radiation fields was reviewed. They targeted volume of tissue appears to be appropriately covered by the radiation beams. .  Based on my personal review, I approved the simulation verification. The patient's treatment will proceed as planned.  -----------------------------------  Blair Promise, PhD, MD

## 2014-05-27 NOTE — Progress Notes (Signed)
  Radiation Oncology         [phone] ________________________________  Name: Michael Cantrell MRN: [id number]  Date: 05/27/2014  DOB: Aug 19, 1950  Electron beam simulation  NOTE    ICD-9-CM ICD-10-CM   1. Prostate cancer 74 C61     DIAGNOSIS: Metastatic prostate cancer  NARRATIVE:  The patient was brought to the linear accelerator treatment suite suite.  Identity was confirmed.  All relevant records and images related to the planned course of therapy were reviewed.  The patient freely provided informed written consent to proceed with treatment after reviewing the details related to the planned course of therapy. The consent form was witnessed and verified by the simulation staff.  Then, the patient was set-up in a stable reproducible position for radiation therapy.   patient had set up of 2 separate electron fields treating the nipple/breast area. Previously developed electron cutouts were used in this set up. PLAN:  The patient will receive 15 gray  Gy in 3 fractions.  The patient will be treated with 9 megavoltage electrons. ________________________________ Gery Pray, MD

## 2014-05-28 ENCOUNTER — Ambulatory Visit
Admission: RE | Admit: 2014-05-28 | Discharge: 2014-05-28 | Disposition: A | Discharge status: Home / Self Care | Payer: Self-pay | Source: Ambulatory Visit | Attending: Radiation Oncology | Admitting: Radiation Oncology

## 2014-05-28 DIAGNOSIS — C61 Malignant neoplasm of prostate: Secondary | ICD-10-CM

## 2014-05-29 ENCOUNTER — Ambulatory Visit
Admission: RE | Admit: 2014-05-29 | Discharge: 2014-05-29 | Disposition: A | Discharge status: Home / Self Care | Payer: Self-pay | Source: Ambulatory Visit | Attending: Radiation Oncology | Admitting: Radiation Oncology

## 2014-05-29 ENCOUNTER — Encounter: Payer: Self-pay | Admitting: Radiation Oncology

## 2014-05-29 VITALS — BP 142/87 | HR 75 | Temp 97.7°F | Resp 20 | Wt 195.5 lb

## 2014-05-29 DIAGNOSIS — C61 Malignant neoplasm of prostate: Secondary | ICD-10-CM

## 2014-05-29 NOTE — Progress Notes (Signed)
Patient completed three treatments to prevent gynecomastia. He denies pain, fatigue, skin irritation, loss of appetite.

## 2014-05-29 NOTE — Addendum Note (Signed)
Encounter addended by: Andria Rhein, RN on: 05/29/2014  9:08 AM<BR>     Documentation filed: Notes Section

## 2014-05-29 NOTE — Progress Notes (Signed)
  Radiation Oncology         [phone] ________________________________  Name: EKANSH SHERK MRN: [id number]  Date: 05/29/2014  DOB: 1950/07/18  Weekly Radiation Therapy Management  Diagnosis: Recurrent prostate cancer  Current Dose: 15 Gy     Planned Dose:  15 Gy  Narrative . . . . . . . . The patient presents for routine under treatment assessment.                                   The patient is without complaint. He denies any soreness or itching along the breast area                                 Set-up films were reviewed.                                 The chart was checked. Physical Findings. . .  weight is 195 lb 8 oz (88.678 kg). His temperature is 97.7 F (36.5 C). His blood pressure is 142/87 and his pulse is 75. His respiration is 20. [name] No significant radiation reaction noted along the breast area Impression . . . . . . . The patient is tolerating radiation. Plan . . . . . . . . . . . . The patient will meet with Dr. Diona Fanti later this spring to initiate androgen ablation. The patient will follow-up in radiation oncology on a when necessary basis. Patient understands he will get some soreness along the breast/ nipple area and some erythema and hyperpigmentation changes over the next 2-3 weeks.  ________________________________   Blair Promise, PhD, MD

## 2014-05-29 NOTE — Progress Notes (Signed)
Per Dr Sondra Come, called Raquel and Loreta Ave, [employer], med onc to assist patient today. Left voice mail for Lenise with patient's name, MRN, DOB and requested she or Raquel contact patient by phone asap for his concerns. Informed the patient and his wife who verbalized understanding and appreciation.

## 2014-06-02 ENCOUNTER — Ambulatory Visit: Payer: Self-pay | Admitting: Radiation Oncology

## 2014-06-15 ENCOUNTER — Encounter: Payer: Self-pay | Admitting: Radiation Oncology

## 2014-06-15 NOTE — Progress Notes (Signed)
  Radiation Oncology         [phone] ________________________________  Name: Michael Cantrell MRN: [id number]  Date: 06/15/2014  DOB: Apr 17, 1950  End of Treatment Note  Diagnosis:   Recurrent prostate cancer     Indication for treatment:  Upcoming androgen ablation, prophylactic radiation therapy directed at the breast tissue to prevent gynecomastia       Radiation treatment dates:   March 1, March 2, March 3  Site/dose:   Bilateral breast tissue 15 gray in 3 fractions  Beams/energy:   Custom electron cutout field directed at the left and right breast area, 9 MeV electrons prescribed to the 95% isodose line  Narrative: The patient tolerated radiation treatment relatively well.   He did not experience any side effects during the course of his treatment  Plan: The patient has completed radiation treatment. The patient will return to radiation oncology clinic for routine followup in one month. I advised them to call or return sooner if they have any questions or concerns related to their recovery or treatment.  -----------------------------------  Blair Promise, PhD, MD

## 2014-07-18 DIAGNOSIS — I10 Essential (primary) hypertension: Secondary | ICD-10-CM | POA: Insufficient documentation

## 2014-08-12 ENCOUNTER — Ambulatory Visit (INDEPENDENT_AMBULATORY_CARE_PROVIDER_SITE_OTHER): Payer: Self-pay | Admitting: Urology

## 2014-08-12 DIAGNOSIS — C61 Malignant neoplasm of prostate: Secondary | ICD-10-CM

## 2014-12-10 DIAGNOSIS — I071 Rheumatic tricuspid insufficiency: Secondary | ICD-10-CM | POA: Insufficient documentation

## 2014-12-24 DIAGNOSIS — I482 Chronic atrial fibrillation, unspecified: Secondary | ICD-10-CM | POA: Insufficient documentation

## 2015-07-02 ENCOUNTER — Encounter: Payer: Self-pay | Admitting: Family Medicine

## 2015-07-02 ENCOUNTER — Ambulatory Visit
Admission: RE | Admit: 2015-07-02 | Discharge: 2015-07-02 | Disposition: A | Discharge status: Home / Self Care | Payer: Self-pay | Source: Ambulatory Visit | Attending: Family Medicine | Admitting: Family Medicine

## 2015-07-02 ENCOUNTER — Ambulatory Visit (INDEPENDENT_AMBULATORY_CARE_PROVIDER_SITE_OTHER): Payer: Self-pay | Admitting: Family Medicine

## 2015-07-02 VITALS — BP 132/84 | HR 105 | Temp 98.1°F | Resp 18 | Ht 67.0 in | Wt 191.1 lb

## 2015-07-02 DIAGNOSIS — E782 Mixed hyperlipidemia: Secondary | ICD-10-CM | POA: Insufficient documentation

## 2015-07-02 DIAGNOSIS — R058 Other specified cough: Secondary | ICD-10-CM

## 2015-07-02 DIAGNOSIS — C61 Malignant neoplasm of prostate: Secondary | ICD-10-CM

## 2015-07-02 DIAGNOSIS — J181 Lobar pneumonia, unspecified organism: Principal | ICD-10-CM

## 2015-07-02 DIAGNOSIS — R05 Cough: Secondary | ICD-10-CM

## 2015-07-02 DIAGNOSIS — J189 Pneumonia, unspecified organism: Secondary | ICD-10-CM | POA: Insufficient documentation

## 2015-07-02 MED ORDER — AMOXICILLIN-POT CLAVULANATE 875-125 MG PO TABS: 1.0000 | ORAL_TABLET | Freq: Two times a day (BID) | ORAL | Status: DC

## 2015-07-02 MED ORDER — HYDROCOD POLST-CPM POLST ER 10-8 MG/5ML PO SUER: 5.0000 mL | Freq: Every evening | ORAL | Status: DC | PRN

## 2015-07-02 MED ORDER — MOXIFLOXACIN HCL 400 MG PO TABS: 400.0000 mg | ORAL_TABLET | Freq: Every day | ORAL | Status: DC
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
## 2015-07-03 LAB — CBC
HEMOGLOBIN: 15.7 g/dL (ref 12.6–17.7)
Hematocrit: 46 % (ref 37.5–51.0)
MCH: 33.3 pg — AB (ref 26.6–33.0)
MCHC: 34.1 g/dL (ref 31.5–35.7)
MCV: 98 fL — ABNORMAL HIGH (ref 79–97)
Platelets: 166 10*3/uL (ref 150–379)
RBC: 4.72 x10E6/uL (ref 4.14–5.80)
RDW: 13.8 % (ref 12.3–15.4)
WBC: 11 10*3/uL — ABNORMAL HIGH (ref 3.4–10.8)

## 2015-07-09 ENCOUNTER — Encounter: Payer: Self-pay | Admitting: Family Medicine

## 2015-07-09 ENCOUNTER — Ambulatory Visit (INDEPENDENT_AMBULATORY_CARE_PROVIDER_SITE_OTHER): Payer: Self-pay | Admitting: Family Medicine

## 2015-07-09 VITALS — BP 130/76 | HR 91 | Temp 97.9°F | Resp 14 | Ht 67.0 in | Wt 191.6 lb

## 2015-07-09 DIAGNOSIS — J181 Lobar pneumonia, unspecified organism: Principal | ICD-10-CM

## 2015-07-09 DIAGNOSIS — J189 Pneumonia, unspecified organism: Secondary | ICD-10-CM

## 2015-07-09 NOTE — Patient Instructions (Addendum)
Please do eat yogurt daily or take a probiotic daily for the next month or two We want to replace the healthy germs in the gut If you notice foul, watery diarrhea in the next two months, schedule an appointment RIGHT AWAY  Return in one month for a pneumonia vaccine (PPSV-23)  If you need [place], we are here  Schedule your Welcome to Medicare 40 minute visit at your convenience (to be on or after your 65th birthday)

## 2015-07-09 NOTE — Progress Notes (Signed)
BP 130/76 mmHg  Pulse 91  Temp(Src) 97.9 F (36.6 C) (Oral)  Resp 14  Ht 5\' 7"  (1.702 m)  Wt 191 lb 9.6 oz (86.909 kg)  BMI 30.00 kg/m2  SpO2 97%   Subjective:    Patient ID: Michael Cantrell, male    DOB: [birth year], 65 y.o.   MRN: [id number]  HPI: Michael Cantrell is a 65 y.o. male  Chief Complaint  Patient presents with  . Pneumonia    doing much better  . Ear Fullness    states feel clogged   He was diagnosed with pneumonia on July 02, 2015 by colleague here; CXR done, reviewed DG Chest 2 View   Status: Final result       PACS Images     Show images for DG Chest 2 View     Study Result     CLINICAL DATA: Productive cough.  EXAM: CHEST 2 VIEW  COMPARISON: 03/22 2015.  FINDINGS: Mediastinum and hilar structures normal. Mild left base subsegmental atelectasis and or infiltrate. Heart size normal. No pleural effusion pneumothorax. No acute bony abnormality .  IMPRESSION: Mild left base subsegmental atelectasis and or infiltrate.   Electronically Signed  By: Marcello Moores Register  On: 07/02/2015 16:42   Has to clear throat a little but that's it, otherwise not brining up phlegm Infection got up into the head, nose messed up Ears have stopped up now Chest is so much better He has the tussionex if needed Doing okay with the antibiotics; no diarrhea Had a little bit of fever and it went up 101.2; went to 96.5 at one point Never had a pneumonia vaccine   Depression screen [facility] 2/9 07/02/2015  Decreased Interest 0  Down, Depressed, Hopeless 0  PHQ - 2 Score 0   Relevant past medical, social history reviewed Interim medical history since our last visit reviewed. Allergies and medications reviewed  Review of Systems Per HPI unless specifically indicated above     Objective:    BP 130/76 mmHg  Pulse 91  Temp(Src) 97.9 F (36.6 C) (Oral)  Resp 14  Ht 5\' 7"  (1.702 m)  Wt 191 lb 9.6 oz (86.909 kg)  BMI 30.00 kg/m2  SpO2 97%  Wt  Readings from Last 3 Encounters:  07/09/15 191 lb 9.6 oz (86.909 kg)  07/02/15 191 lb 1.6 oz (86.682 kg)  05/29/14 195 lb 8 oz (88.678 kg)    Physical Exam  Constitutional: He appears well-developed and well-nourished. No distress.  HENT:  Head: Normocephalic and atraumatic.  Eyes: EOM are normal. Right eye exhibits no discharge. Left eye exhibits no discharge. No scleral icterus.  Cardiovascular: Normal rate and regular rhythm.   Pulmonary/Chest: Effort normal and breath sounds normal. No respiratory distress. He has no wheezes. He has no rales.  Abdominal: He exhibits no distension.  Skin: He is not diaphoretic. No pallor.  Psychiatric: He has a normal mood and affect. His mood appears not anxious. He does not exhibit a depressed mood.    Results for orders placed or performed in visit on 07/02/15  CBC  Result Value Ref Range   WBC 11.0 (H) 3.4 - 10.8 x10E3/uL   RBC 4.72 4.14 - 5.80 x10E6/uL   Hemoglobin 15.7 12.6 - 17.7 g/dL   Hematocrit 46.0 37.5 - 51.0 %   MCV 98 (H) 79 - 97 fL   MCH 33.3 (H) 26.6 - 33.0 pg   MCHC 34.1 31.5 - 35.7 g/dL   RDW 13.8 12.3 - 15.4 %  Platelets 166 150 - 379 x10E3/uL      Assessment & Plan:   Problem List Items Addressed This Visit      Respiratory   LLL pneumonia - Primary    Clinically resolving; finish out antibiotics; cautioned about risk of C diff, yogurt or probiotics advised; return for pneumonia vaccine in the next month (PPSV-23)         Follow up plan: No Follow-up on file.  An after-visit summary was printed and given to the patient at [place].  Please see the patient instructions which may contain other information and recommendations beyond what is mentioned above in the assessment and plan.

## 2015-08-02 ENCOUNTER — Encounter: Payer: Self-pay | Admitting: Family Medicine

## 2015-08-02 DIAGNOSIS — J189 Pneumonia, unspecified organism: Secondary | ICD-10-CM

## 2015-08-02 HISTORY — DX: Pneumonia, unspecified organism: J18.9

## 2015-08-02 NOTE — Assessment & Plan Note (Signed)
Clinically resolving; finish out antibiotics; cautioned about risk of C diff, yogurt or probiotics advised; return for pneumonia vaccine in the next month (PPSV-23)

## 2015-08-10 ENCOUNTER — Encounter: Payer: Self-pay | Admitting: Family Medicine

## 2015-08-10 ENCOUNTER — Ambulatory Visit (INDEPENDENT_AMBULATORY_CARE_PROVIDER_SITE_OTHER): Payer: Self-pay | Admitting: Family Medicine

## 2015-08-10 ENCOUNTER — Telehealth: Payer: Self-pay

## 2015-08-10 VITALS — BP 124/78 | HR 89 | Temp 98.0°F | Resp 16 | Wt 191.0 lb

## 2015-08-10 DIAGNOSIS — Z23 Encounter for immunization: Secondary | ICD-10-CM

## 2015-08-10 NOTE — Progress Notes (Signed)
   BP 124/78 mmHg  Pulse 89  Temp(Src) 98 F (36.7 C) (Oral)  Resp 16  Wt 191 lb (86.637 kg)  SpO2 97%   Subjective:    Patient ID: Michael Cantrell, male    DOB: 02-03-1951, 65 y.o.   MRN: [id number]  HPI: Michael Cantrell is a 65 y.o. male  Chief Complaint  Patient presents with  . Follow-up    1 month   He is just here for a pneumonia vaccine He did not have to see the doctor Orders given for pneumonia vaccine  Review of Systems    Objective:    BP 124/78 mmHg  Pulse 89  Temp(Src) 98 F (36.7 C) (Oral)  Resp 16  Wt 191 lb (86.637 kg)  SpO2 97%  Wt Readings from Last 3 Encounters:  08/10/15 191 lb (86.637 kg)  07/09/15 191 lb 9.6 oz (86.909 kg)  07/02/15 191 lb 1.6 oz (86.682 kg)    Physical Exam     Assessment & Plan:   Problem List Items Addressed This Visit    None    Visit Diagnoses    Need for pneumococcal vaccination    -  Primary    Relevant Orders    Pneumococcal polysaccharide vaccine 23-valent greater than or equal to 2yo subcutaneous/IM (Completed)       Follow up plan: No Follow-up on file.  An after-visit summary was printed and given to the patient at [facility].  Please see the patient instructions which may contain other information and recommendations beyond what is mentioned above in the assessment and plan.  Orders Placed This Encounter  Procedures  . Pneumococcal polysaccharide vaccine 23-valent greater than or equal to 2yo subcutaneous/IM

## 2015-08-10 NOTE — Patient Instructions (Signed)
You have received the Pneumovax vaccine (PPSV-23) You will need another booster in five years per current ACIP guidelines You can get the Prevnar vaccine (PCV-13), a different type of pneumonia vaccine, in 12+ months Wait at least one year before you get this other type of pneumonia vaccine

## 2015-08-10 NOTE — Telephone Encounter (Signed)
Since there was a note opened you will have to close the encounter from today from the pneumonia shot.

## 2015-09-05 ENCOUNTER — Encounter: Payer: Self-pay | Admitting: *Deleted

## 2015-09-05 ENCOUNTER — Emergency Department
Admission: EM | Admit: 2015-09-05 | Discharge: 2015-09-06 | Disposition: A | Discharge status: Home / Self Care | Payer: Self-pay | Attending: Emergency Medicine | Admitting: Emergency Medicine

## 2015-09-05 ENCOUNTER — Emergency Department: Payer: Self-pay

## 2015-09-05 DIAGNOSIS — S0181XA Laceration without foreign body of other part of head, initial encounter: Secondary | ICD-10-CM | POA: Insufficient documentation

## 2015-09-05 DIAGNOSIS — Z8546 Personal history of malignant neoplasm of prostate: Secondary | ICD-10-CM | POA: Insufficient documentation

## 2015-09-05 DIAGNOSIS — IMO0002 Reserved for concepts with insufficient information to code with codable children: Secondary | ICD-10-CM

## 2015-09-05 DIAGNOSIS — Y9389 Activity, other specified: Secondary | ICD-10-CM | POA: Insufficient documentation

## 2015-09-05 DIAGNOSIS — S0990XA Unspecified injury of head, initial encounter: Secondary | ICD-10-CM

## 2015-09-05 DIAGNOSIS — Z79899 Other long term (current) drug therapy: Secondary | ICD-10-CM | POA: Insufficient documentation

## 2015-09-05 DIAGNOSIS — S01111A Laceration without foreign body of right eyelid and periocular area, initial encounter: Secondary | ICD-10-CM | POA: Insufficient documentation

## 2015-09-05 DIAGNOSIS — S0121XA Laceration without foreign body of nose, initial encounter: Secondary | ICD-10-CM | POA: Insufficient documentation

## 2015-09-05 DIAGNOSIS — Z8679 Personal history of other diseases of the circulatory system: Secondary | ICD-10-CM | POA: Insufficient documentation

## 2015-09-05 DIAGNOSIS — W1809XA Striking against other object with subsequent fall, initial encounter: Secondary | ICD-10-CM | POA: Insufficient documentation

## 2015-09-05 DIAGNOSIS — E785 Hyperlipidemia, unspecified: Secondary | ICD-10-CM | POA: Insufficient documentation

## 2015-09-05 DIAGNOSIS — S01511A Laceration without foreign body of lip, initial encounter: Secondary | ICD-10-CM | POA: Insufficient documentation

## 2015-09-05 DIAGNOSIS — Z7982 Long term (current) use of aspirin: Secondary | ICD-10-CM | POA: Insufficient documentation

## 2015-09-05 DIAGNOSIS — Y9289 Other specified places as the place of occurrence of the external cause: Secondary | ICD-10-CM | POA: Insufficient documentation

## 2015-09-05 DIAGNOSIS — Y999 Unspecified external cause status: Secondary | ICD-10-CM | POA: Insufficient documentation

## 2015-09-05 MED ORDER — LIDOCAINE-EPINEPHRINE (PF) 1 %-1:200000 IJ SOLN
INTRAMUSCULAR | Status: AC
  Administered 2015-09-06: 10 mL via SUBCUTANEOUS
  Filled 2015-09-05: qty 30
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
## 2015-09-06 MED ORDER — BACITRACIN ZINC 500 UNIT/GM EX OINT
TOPICAL_OINTMENT | Freq: Two times a day (BID) | CUTANEOUS | Status: AC
  Administered 2015-09-06: 03:00:00 via TOPICAL
  Filled 2015-09-06: qty 0.9

## 2015-09-06 MED ORDER — BACITRACIN ZINC 500 UNIT/GM EX OINT: TOPICAL_OINTMENT | CUTANEOUS | Status: DC

## 2015-09-06 NOTE — Discharge Instructions (Signed)
Head Injury, Adult °You have a head injury. Headaches and throwing up (vomiting) are common after a head injury. It should be easy to wake up from sleeping. Sometimes you must stay in the hospital. Most problems happen within the first 24 hours. Side effects may occur up to 7-10 days after the injury.  °WHAT ARE THE TYPES OF HEAD INJURIES? °Head injuries can be as minor as a bump. Some head injuries can be more severe. More severe head injuries include: °· A jarring injury to the brain (concussion). °· A bruise of the brain (contusion). This mean there is bleeding in the brain that can cause swelling. °· A cracked skull (skull fracture). °· Bleeding in the brain that collects, clots, and forms a bump (hematoma). °WHEN SHOULD I GET HELP RIGHT AWAY?  °· You are confused or sleepy. °· You cannot be woken up. °· You feel sick to your stomach (nauseous) or keep throwing up (vomiting). °· Your dizziness or unsteadiness is getting worse. °· You have very bad, lasting headaches that are not helped by medicine. Take medicines only as told by your doctor. °· You cannot use your arms or legs like normal. °· You cannot walk. °· You notice changes in the black spots in the center of the colored part of your eye (pupil). °· You have clear or bloody fluid coming from your nose or ears. °· You have trouble seeing. °During the next 24 hours after the injury, you must stay with someone who can watch you. This person should get help right away (call 911 in the [place] if you start to shake and are not able to control it (have seizures), you pass out, or you are unable to wake up. °HOW CAN I PREVENT A HEAD INJURY IN THE FUTURE? °· Wear seat belts. °· Wear a helmet while bike riding and playing sports like football. °· Stay away from dangerous activities around the house. °WHEN CAN I RETURN TO NORMAL ACTIVITIES AND ATHLETICS? °See your doctor before doing these activities. You should not do normal activities or play contact sports until 1  week after the following symptoms have stopped: °· Headache that does not go away. °· Dizziness. °· Poor attention. °· Confusion. °· Memory problems. °· Sickness to your stomach or throwing up. °· Tiredness. °· Fussiness. °· Bothered by bright lights or loud noises. °· Anxiousness or depression. °· Restless sleep. °MAKE SURE YOU:  °· Understand these instructions. °· Will watch your condition. °· Will get help right away if you are not doing well or get worse. °  °This information is not intended to replace advice given to you by your health care provider. Make sure you discuss any questions you have with your health care provider. °  °Document Released: 02/25/2008 Document Revised: 04/04/2014 Document Reviewed: 11/19/2012 °Elsevier Interactive Patient Education ©2016 [employer] ° °
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
## 2015-09-07 ENCOUNTER — Telehealth: Payer: Self-pay

## 2015-09-07 NOTE — Telephone Encounter (Signed)
Pt notified about going back to ER for stitch removal.

## 2015-09-11 ENCOUNTER — Emergency Department
Admission: EM | Admit: 2015-09-11 | Discharge: 2015-09-11 | Disposition: A | Discharge status: Home / Self Care | Payer: Self-pay | Attending: Emergency Medicine | Admitting: Emergency Medicine

## 2015-09-11 DIAGNOSIS — Z7982 Long term (current) use of aspirin: Secondary | ICD-10-CM | POA: Insufficient documentation

## 2015-09-11 DIAGNOSIS — Z4802 Encounter for removal of sutures: Secondary | ICD-10-CM | POA: Insufficient documentation

## 2015-09-11 DIAGNOSIS — Z8546 Personal history of malignant neoplasm of prostate: Secondary | ICD-10-CM | POA: Insufficient documentation

## 2015-09-11 DIAGNOSIS — Z79899 Other long term (current) drug therapy: Secondary | ICD-10-CM | POA: Insufficient documentation

## 2015-09-11 DIAGNOSIS — I482 Chronic atrial fibrillation: Secondary | ICD-10-CM | POA: Insufficient documentation

## 2015-09-11 DIAGNOSIS — I119 Hypertensive heart disease without heart failure: Secondary | ICD-10-CM | POA: Insufficient documentation

## 2015-09-11 DIAGNOSIS — E785 Hyperlipidemia, unspecified: Secondary | ICD-10-CM | POA: Insufficient documentation

## 2015-09-11 NOTE — ED Notes (Signed)
PA at bedside removing sutures.

## 2015-09-11 NOTE — Discharge Instructions (Signed)

## 2015-09-11 NOTE — ED Notes (Signed)
Pt verbalized understanding of discharge instructions. NAD at this time. 
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
## 2015-10-28 DIAGNOSIS — C61 Malignant neoplasm of prostate: Secondary | ICD-10-CM | POA: Diagnosis not present

## 2015-10-28 DIAGNOSIS — R9721 Rising PSA following treatment for malignant neoplasm of prostate: Secondary | ICD-10-CM | POA: Diagnosis not present

## 2015-12-01 DIAGNOSIS — H40003 Preglaucoma, unspecified, bilateral: Secondary | ICD-10-CM | POA: Diagnosis not present

## 2015-12-08 DIAGNOSIS — H40003 Preglaucoma, unspecified, bilateral: Secondary | ICD-10-CM | POA: Diagnosis not present

## 2015-12-17 ENCOUNTER — Emergency Department
Admission: EM | Admit: 2015-12-17 | Discharge: 2015-12-17 | Disposition: A | Discharge status: Home / Self Care | Payer: Medicare Other | Attending: Student in an Organized Health Care Education/Training Program | Admitting: Student in an Organized Health Care Education/Training Program

## 2015-12-17 ENCOUNTER — Encounter: Payer: Self-pay | Admitting: Family Medicine

## 2015-12-17 ENCOUNTER — Ambulatory Visit (INDEPENDENT_AMBULATORY_CARE_PROVIDER_SITE_OTHER): Payer: Medicare Other | Admitting: Family Medicine

## 2015-12-17 ENCOUNTER — Emergency Department: Payer: Medicare Other

## 2015-12-17 VITALS — BP 146/77 | HR 132 | Temp 98.3°F | Resp 18 | Ht 66.0 in | Wt 187.5 lb

## 2015-12-17 DIAGNOSIS — Z79899 Other long term (current) drug therapy: Secondary | ICD-10-CM | POA: Diagnosis not present

## 2015-12-17 DIAGNOSIS — Z8546 Personal history of malignant neoplasm of prostate: Secondary | ICD-10-CM | POA: Insufficient documentation

## 2015-12-17 DIAGNOSIS — Z791 Long term (current) use of non-steroidal anti-inflammatories (NSAID): Secondary | ICD-10-CM | POA: Insufficient documentation

## 2015-12-17 DIAGNOSIS — Z7982 Long term (current) use of aspirin: Secondary | ICD-10-CM | POA: Insufficient documentation

## 2015-12-17 DIAGNOSIS — I480 Paroxysmal atrial fibrillation: Secondary | ICD-10-CM | POA: Insufficient documentation

## 2015-12-17 DIAGNOSIS — J0100 Acute maxillary sinusitis, unspecified: Secondary | ICD-10-CM | POA: Diagnosis not present

## 2015-12-17 DIAGNOSIS — R002 Palpitations: Secondary | ICD-10-CM | POA: Diagnosis present

## 2015-12-17 DIAGNOSIS — I4891 Unspecified atrial fibrillation: Secondary | ICD-10-CM

## 2015-12-17 DIAGNOSIS — I10 Essential (primary) hypertension: Secondary | ICD-10-CM | POA: Diagnosis not present

## 2015-12-17 DIAGNOSIS — E860 Dehydration: Secondary | ICD-10-CM | POA: Insufficient documentation

## 2015-12-17 DIAGNOSIS — R05 Cough: Secondary | ICD-10-CM | POA: Diagnosis not present

## 2015-12-17 DIAGNOSIS — R000 Tachycardia, unspecified: Secondary | ICD-10-CM | POA: Insufficient documentation

## 2015-12-17 HISTORY — DX: Unspecified atrial fibrillation: I48.91

## 2015-12-17 LAB — BASIC METABOLIC PANEL
Anion gap: 9 (ref 5–15)
Anion gap: 10 (ref 5–15)
BUN: 33 mg/dL — AB (ref 6–20)
BUN: 29 mg/dL — AB (ref 6–20)
CHLORIDE: 103 mmol/L (ref 101–111)
CO2: 26 mmol/L (ref 22–32)
CO2: 22 mmol/L (ref 22–32)
CREATININE: 1.19 mg/dL (ref 0.61–1.24)
Calcium: 10.8 mg/dL — ABNORMAL HIGH (ref 8.9–10.3)
Calcium: 10.1 mg/dL (ref 8.9–10.3)
Chloride: 101 mmol/L (ref 101–111)
Creatinine, Ser: 1.35 mg/dL — ABNORMAL HIGH (ref 0.61–1.24)
GFR calc Af Amer: >60 mL/min (ref >60)
GFR calc Af Amer: >60 mL/min (ref >60)
GFR calc non Af Amer: 54 mL/min — ABNORMAL LOW (ref >60)
GLUCOSE: 106 mg/dL — AB (ref 65–99)
GLUCOSE: 122 mg/dL — AB (ref 65–99)
POTASSIUM: 3.6 mmol/L (ref 3.5–5.1)
POTASSIUM: 3.9 mmol/L (ref 3.5–5.1)
SODIUM: 135 mmol/L (ref 135–145)
Sodium: 136 mmol/L (ref 135–145)

## 2015-12-17 LAB — CBC
HEMATOCRIT: 50 % (ref 40.0–52.0)
Hemoglobin: 16.9 g/dL (ref 13.0–18.0)
MCH: 33 pg (ref 26.0–34.0)
MCHC: 33.9 g/dL (ref 32.0–36.0)
MCV: 97.4 fL (ref 80.0–100.0)
PLATELETS: 158 10*3/uL (ref 150–440)
RBC: 5.13 MIL/uL (ref 4.40–5.90)
RDW: 14.4 % (ref 11.5–14.5)
WBC: 10.5 10*3/uL (ref 3.8–10.6)

## 2015-12-17 LAB — LACTIC ACID, PLASMA: Lactic Acid, Venous: 1.7 mmol/L (ref 0.5–1.9)

## 2015-12-17 LAB — MAGNESIUM: MAGNESIUM: 1.7 mg/dL (ref 1.7–2.4)

## 2015-12-17 MED ORDER — DILTIAZEM HCL 60 MG PO TABS
60.0000 mg | ORAL_TABLET | Freq: Once | ORAL | Status: AC
  Administered 2015-12-17: 60 mg via ORAL
  Filled 2015-12-17: qty 1

## 2015-12-17 MED ORDER — METOPROLOL TARTRATE 25 MG PO TABS
12.5000 mg | ORAL_TABLET | Freq: Once | ORAL | Status: AC
  Administered 2015-12-17: 12.5 mg via ORAL
  Filled 2015-12-17: qty 1

## 2015-12-17 MED ORDER — DILTIAZEM HCL 25 MG/5ML IV SOLN
INTRAVENOUS | Status: AC
  Administered 2015-12-17: 15 mg via INTRAVENOUS
  Filled 2015-12-17: qty 5

## 2015-12-17 MED ORDER — METOPROLOL SUCCINATE ER 25 MG PO TB24: 25.0000 mg | ORAL_TABLET | Freq: Every day | ORAL | 0 refills | Status: DC

## 2015-12-17 MED ORDER — SODIUM CHLORIDE 0.9 % IV BOLUS (SEPSIS)
1000.0000 mL | Freq: Once | INTRAVENOUS | Status: AC
  Administered 2015-12-17: 1000 mL via INTRAVENOUS

## 2015-12-17 MED ORDER — AMOXICILLIN-POT CLAVULANATE 875-125 MG PO TABS: 1.0000 | ORAL_TABLET | Freq: Two times a day (BID) | ORAL | 0 refills | Status: DC

## 2015-12-17 MED ORDER — DILTIAZEM HCL 25 MG/5ML IV SOLN
15.0000 mg | Freq: Once | INTRAVENOUS | Status: AC
  Administered 2015-12-17: 15 mg via INTRAVENOUS
  Filled 2015-12-17: qty 5

## 2015-12-17 MED ORDER — MAGNESIUM SULFATE 2 GM/50ML IV SOLN
2.0000 g | Freq: Once | INTRAVENOUS | Status: AC
  Administered 2015-12-17: 2 g via INTRAVENOUS
  Filled 2015-12-17: qty 50

## 2015-12-17 MED ORDER — DILTIAZEM HCL 100 MG IV SOLR
5.0000 mg/h | Freq: Once | INTRAVENOUS | Status: DC
  Filled 2015-12-17: qty 100

## 2015-12-17 NOTE — ED Notes (Signed)
MD at bedside. 

## 2015-12-17 NOTE — ED Notes (Signed)
Dry, nonproductive cough. Denies CP or SOB.
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
## 2015-12-17 NOTE — ED Triage Notes (Signed)
Pt sent from MD office for rapid HR. Pt hx of afib. Pt at MD office today for sinus infection. Pt alert and oriented X4, active, cooperative, pt in NAD. RR even and unlabored, color WNL.

## 2015-12-17 NOTE — ED Notes (Signed)
Pt alert and oriented X4, active, cooperative, pt in NAD. RR even and unlabored, color WNL.  Pt informed to return if any life threatening symptoms occur.   

## 2015-12-18 DIAGNOSIS — E782 Mixed hyperlipidemia: Secondary | ICD-10-CM | POA: Diagnosis not present

## 2015-12-18 DIAGNOSIS — I482 Chronic atrial fibrillation: Secondary | ICD-10-CM | POA: Diagnosis not present

## 2015-12-18 DIAGNOSIS — I10 Essential (primary) hypertension: Secondary | ICD-10-CM | POA: Diagnosis not present

## 2015-12-18 DIAGNOSIS — I4891 Unspecified atrial fibrillation: Secondary | ICD-10-CM | POA: Diagnosis not present

## 2015-12-28 DIAGNOSIS — I4891 Unspecified atrial fibrillation: Secondary | ICD-10-CM | POA: Diagnosis not present

## 2016-01-12 DIAGNOSIS — I6523 Occlusion and stenosis of bilateral carotid arteries: Secondary | ICD-10-CM | POA: Diagnosis not present

## 2016-01-12 DIAGNOSIS — I4891 Unspecified atrial fibrillation: Secondary | ICD-10-CM | POA: Diagnosis not present

## 2016-01-15 DIAGNOSIS — E782 Mixed hyperlipidemia: Secondary | ICD-10-CM | POA: Diagnosis not present

## 2016-01-15 DIAGNOSIS — I6523 Occlusion and stenosis of bilateral carotid arteries: Secondary | ICD-10-CM | POA: Diagnosis not present

## 2016-01-15 DIAGNOSIS — I482 Chronic atrial fibrillation: Secondary | ICD-10-CM | POA: Diagnosis not present

## 2016-01-15 DIAGNOSIS — I10 Essential (primary) hypertension: Secondary | ICD-10-CM | POA: Diagnosis not present

## 2016-02-02 DIAGNOSIS — Z23 Encounter for immunization: Secondary | ICD-10-CM | POA: Diagnosis not present

## 2016-02-03 ENCOUNTER — Other Ambulatory Visit
Admission: RE | Admit: 2016-02-03 | Discharge: 2016-02-03 | Disposition: A | Discharge status: Home / Self Care | Payer: Medicare Other | Source: Ambulatory Visit | Attending: Ophthalmology | Admitting: Ophthalmology

## 2016-02-03 DIAGNOSIS — B029 Zoster without complications: Secondary | ICD-10-CM | POA: Diagnosis not present

## 2016-02-03 DIAGNOSIS — M316 Other giant cell arteritis: Secondary | ICD-10-CM | POA: Insufficient documentation

## 2016-02-03 LAB — C-REACTIVE PROTEIN: CRP: <0.8 mg/dL (ref <1.0)

## 2016-02-04 ENCOUNTER — Other Ambulatory Visit
Admission: RE | Admit: 2016-02-04 | Discharge: 2016-02-04 | Disposition: A | Discharge status: Home / Self Care | Payer: Medicare Other | Source: Ambulatory Visit | Attending: Ophthalmology | Admitting: Ophthalmology

## 2016-02-04 DIAGNOSIS — M316 Other giant cell arteritis: Secondary | ICD-10-CM | POA: Diagnosis not present

## 2016-02-04 LAB — SEDIMENTATION RATE: Sed Rate: 3 mm/hr (ref 0–20)

## 2016-02-04 LAB — PLATELET COUNT: PLATELETS: 147 10*3/uL — AB (ref 150–440)

## 2016-02-10 ENCOUNTER — Telehealth: Payer: Self-pay | Admitting: Family Medicine

## 2016-02-10 DIAGNOSIS — B029 Zoster without complications: Secondary | ICD-10-CM | POA: Diagnosis not present

## 2016-02-10 NOTE — Telephone Encounter (Signed)
I'm looking ahead to my schedule for tomorrow and see that patient has an appointment for shingles in his eye; he needs to contact his eye doctor immediately

## 2016-02-10 NOTE — Telephone Encounter (Signed)
I was told by staff that he saw his eye doctor today and has already been started on medicine; going to see me for the shingles elsewhere

## 2016-02-11 ENCOUNTER — Ambulatory Visit (INDEPENDENT_AMBULATORY_CARE_PROVIDER_SITE_OTHER): Payer: Medicare Other | Admitting: Family Medicine

## 2016-02-11 ENCOUNTER — Encounter: Payer: Self-pay | Admitting: Family Medicine

## 2016-02-11 DIAGNOSIS — C61 Malignant neoplasm of prostate: Secondary | ICD-10-CM

## 2016-02-11 DIAGNOSIS — B029 Zoster without complications: Secondary | ICD-10-CM | POA: Diagnosis not present

## 2016-02-11 DIAGNOSIS — Z1159 Encounter for screening for other viral diseases: Secondary | ICD-10-CM | POA: Insufficient documentation

## 2016-02-11 MED ORDER — GABAPENTIN 300 MG PO CAPS: ORAL_CAPSULE | ORAL | 0 refills | Status: DC

## 2016-02-11 MED ORDER — HYDROCODONE-ACETAMINOPHEN 5-325 MG PO TABS: 1.0000 | ORAL_TABLET | ORAL | 0 refills | Status: DC | PRN
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
## 2016-02-11 NOTE — Assessment & Plan Note (Signed)
Discussed one-time hep C screening recommendation for individuals born between 1945-1965 per [place]PSTF guidelines; patient agrees with testing; Hep C Ab ordered 

## 2016-02-11 NOTE — Patient Instructions (Addendum)
I would not recommend the shingles vaccine if you are on chemotherapy, and please have the urologist call me if there is any question about that Start the gabapentin to turn the volume down Taper that up, and in a month or two, when pain has resolved, we'll taper it back down Use the pain medicine if needed for pain relief; okay to take ibuprofen with the prescription pain medicine (per package directions)   Shingles Shingles, which is also known as herpes zoster, is an infection that causes a painful skin rash and fluid-filled blisters. Shingles is not related to genital herpes, which is a sexually transmitted infection. Shingles only develops in people who:  Have had chickenpox.  Have received the chickenpox vaccine. (This is rare.) What are the causes? Shingles is caused by varicella-zoster virus (VZV). This is the same virus that causes chickenpox. After exposure to VZV, the virus stays in the body in an inactive (dormant) state. Shingles develops if the virus reactivates. This can happen many years after the initial exposure to VZV. It is not known what causes this virus to reactivate. What increases the risk? People who have had chickenpox or received the chickenpox vaccine are at risk for shingles. Infection is more common in people who:  Are older than age 21.  Have a weakened defense (immune) system, such as those with HIV, AIDS, or cancer.  Are taking medicines that weaken the immune system, such as transplant medicines.  Are under great stress. What are the signs or symptoms? Early symptoms of this condition include itching, tingling, and pain in an area on your skin. Pain may be described as burning, stabbing, or throbbing. A few days or weeks after symptoms start, a painful red rash appears, usually on one side of the body in a bandlike or beltlike pattern. The rash eventually turns into fluid-filled blisters that break open, scab over, and dry up in about 2-3 weeks. At any  time during the infection, you may also develop:  A fever.  Chills.  A headache.  An upset stomach. How is this diagnosed? This condition is diagnosed with a skin exam. Sometimes, skin or fluid samples are taken from the blisters before a diagnosis is made. These samples are examined under a microscope or sent to a lab for testing. How is this treated? There is no specific cure for this condition. Your health care provider will probably prescribe medicines to help you manage pain, recover more quickly, and avoid long-term problems. Medicines may include:  Antiviral drugs.  Anti-inflammatory drugs.  Pain medicines. If the area involved is on your face, you may be referred to a specialist, such as an eye doctor (ophthalmologist) or an ear, nose, and throat (ENT) doctor to help you avoid eye problems, chronic pain, or disability. Follow these instructions at home: Medicines  Take medicines only as directed by your health care provider.  Apply an anti-itch or numbing cream to the affected area as directed by your health care provider. Blister and Rash Care  Take a cool bath or apply cool compresses to the area of the rash or blisters as directed by your health care provider. This may help with pain and itching.  Keep your rash covered with a loose bandage (dressing). Wear loose-fitting clothing to help ease the pain of material rubbing against the rash.  Keep your rash and blisters clean with mild soap and cool water or as directed by your health care provider.  Check your rash every day for signs  of infection. These include redness, swelling, and pain that lasts or increases.  Do not pick your blisters.  Do not scratch your rash. General instructions  Rest as directed by your health care provider.  Keep all follow-up visits as directed by your health care provider. This is important.  Until your blisters scab over, your infection can cause chickenpox in people who have never  had it or been vaccinated against it. To prevent this from happening, avoid contact with other people, especially:  Babies.  Pregnant women.  Children who have eczema.  Elderly people who have transplants.  People who have chronic illnesses, such as leukemia or AIDS. Contact a health care provider if:  Your pain is not relieved with prescribed medicines.  Your pain does not get better after the rash heals.  Your rash looks infected. Signs of infection include redness, swelling, and pain that lasts or increases. Get help right away if:  The rash is on your face or nose.  You have facial pain, pain around your eye area, or loss of feeling on one side of your face.  You have ear pain or you have ringing in your ear.  You have loss of taste.  Your condition gets worse. This information is not intended to replace advice given to you by your health care provider. Make sure you discuss any questions you have with your health care provider. Document Released: 03/14/2005 Document Revised: 11/08/2015 Document Reviewed: 01/23/2014 Elsevier Interactive Patient Education  2017 [employer].

## 2016-02-12 ENCOUNTER — Telehealth: Payer: Self-pay

## 2016-02-12 LAB — HEPATITIS C ANTIBODY: HCV Ab: NEGATIVE

## 2016-02-12 NOTE — Telephone Encounter (Signed)
Went over pt lab work; pt was satisfied with results

## 2016-02-12 NOTE — Telephone Encounter (Signed)
-----   Message from Arnetha Courser, MD sent at 02/12/2016 11:56 AM EST ----- Please let pt know that his hepatitis C test was negative, as expected; good news

## 2016-02-19 DIAGNOSIS — B029 Zoster without complications: Secondary | ICD-10-CM | POA: Insufficient documentation

## 2016-02-19 NOTE — Assessment & Plan Note (Signed)
Being managed by urologist 

## 2016-02-19 NOTE — Assessment & Plan Note (Signed)
So glad that he has seen his eye doctor; was started on valacyclovir right away; I will start him on gabapentin and something stronger for pain management; typical precautions given; taper up the gabapentin, then we'll taper it back down when pain resolved; do not stop abruptly

## 2016-02-24 DIAGNOSIS — B029 Zoster without complications: Secondary | ICD-10-CM | POA: Diagnosis not present

## 2016-02-26 DIAGNOSIS — C61 Malignant neoplasm of prostate: Secondary | ICD-10-CM | POA: Diagnosis not present

## 2016-02-26 DIAGNOSIS — N5201 Erectile dysfunction due to arterial insufficiency: Secondary | ICD-10-CM | POA: Diagnosis not present

## 2016-02-26 DIAGNOSIS — N393 Stress incontinence (female) (male): Secondary | ICD-10-CM | POA: Diagnosis not present

## 2016-03-12 ENCOUNTER — Other Ambulatory Visit: Payer: Self-pay | Admitting: Family Medicine

## 2016-03-14 DIAGNOSIS — B0233 Zoster keratitis: Secondary | ICD-10-CM | POA: Diagnosis not present

## 2016-03-17 ENCOUNTER — Other Ambulatory Visit: Payer: Self-pay | Admitting: Family Medicine

## 2016-03-17 NOTE — Telephone Encounter (Signed)
Tid maintenance

## 2016-03-17 NOTE — Telephone Encounter (Signed)
90 day supply please

## 2016-04-12 DIAGNOSIS — B0233 Zoster keratitis: Secondary | ICD-10-CM | POA: Diagnosis not present

## 2016-04-14 ENCOUNTER — Encounter: Payer: Medicare Other | Admitting: Family Medicine

## 2016-04-20 ENCOUNTER — Encounter: Payer: Self-pay | Admitting: Family Medicine

## 2016-04-20 ENCOUNTER — Ambulatory Visit (INDEPENDENT_AMBULATORY_CARE_PROVIDER_SITE_OTHER): Payer: Medicare Other | Admitting: Family Medicine

## 2016-04-20 DIAGNOSIS — B0229 Other postherpetic nervous system involvement: Secondary | ICD-10-CM | POA: Insufficient documentation

## 2016-04-20 DIAGNOSIS — E782 Mixed hyperlipidemia: Secondary | ICD-10-CM

## 2016-04-20 DIAGNOSIS — Z1211 Encounter for screening for malignant neoplasm of colon: Secondary | ICD-10-CM | POA: Diagnosis not present

## 2016-04-20 DIAGNOSIS — Z5181 Encounter for therapeutic drug level monitoring: Secondary | ICD-10-CM | POA: Diagnosis not present

## 2016-04-20 DIAGNOSIS — I10 Essential (primary) hypertension: Secondary | ICD-10-CM

## 2016-04-20 DIAGNOSIS — C61 Malignant neoplasm of prostate: Secondary | ICD-10-CM | POA: Diagnosis not present

## 2016-04-20 DIAGNOSIS — H9190 Unspecified hearing loss, unspecified ear: Secondary | ICD-10-CM | POA: Insufficient documentation

## 2016-04-20 DIAGNOSIS — H9191 Unspecified hearing loss, right ear: Secondary | ICD-10-CM

## 2016-04-20 DIAGNOSIS — Z0000 Encounter for general adult medical examination without abnormal findings: Secondary | ICD-10-CM | POA: Diagnosis not present

## 2016-04-20 DIAGNOSIS — I482 Chronic atrial fibrillation, unspecified: Secondary | ICD-10-CM

## 2016-04-20 DIAGNOSIS — D492 Neoplasm of unspecified behavior of bone, soft tissue, and skin: Secondary | ICD-10-CM | POA: Diagnosis not present

## 2016-04-20 LAB — CBC WITH DIFFERENTIAL/PLATELET
BASOS PCT: 1 %
Basophils Absolute: 80 cells/uL (ref 0–200)
EOS ABS: 160 {cells}/uL (ref 15–500)
Eosinophils Relative: 2 %
HEMATOCRIT: 49.5 % (ref 38.5–50.0)
Hemoglobin: 16.6 g/dL (ref 13.2–17.1)
Lymphocytes Relative: 28 %
Lymphs Abs: 2240 cells/uL (ref 850–3900)
MCH: 32.7 pg (ref 27.0–33.0)
MCHC: 33.5 g/dL (ref 32.0–36.0)
MCV: 97.6 fL (ref 80.0–100.0)
MONO ABS: 960 {cells}/uL — AB (ref 200–950)
MPV: 11.9 fL (ref 7.5–12.5)
Monocytes Relative: 12 %
NEUTROS ABS: 4560 {cells}/uL (ref 1500–7800)
Neutrophils Relative %: 57 %
PLATELETS: 214 10*3/uL (ref 140–400)
RBC: 5.07 MIL/uL (ref 4.20–5.80)
RDW: 13.7 % (ref 11.0–15.0)
WBC: 8 10*3/uL (ref 3.8–10.8)

## 2016-04-20 MED ORDER — PREGABALIN 75 MG PO CAPS: 75.0000 mg | ORAL_CAPSULE | Freq: Two times a day (BID) | ORAL | 2 refills | Status: DC

## 2016-04-20 NOTE — Patient Instructions (Addendum)
Return for a pneumonia vaccine on Aug 09, 2016 Try to lose 5-10 pounds We've put in referrals for audiology and colonoscopy We'll get labs today Use the new Lyrica for pain, but cut back on your alcohol to just one glass at night  Health Maintenance, Male A healthy lifestyle and preventative care can promote health and wellness.  Maintain regular health, dental, and eye exams.  Eat a healthy diet. Foods like vegetables, fruits, whole grains, low-fat dairy products, and lean protein foods contain the nutrients you need and are low in calories. Decrease your intake of foods high in solid fats, added sugars, and salt. Get information about a proper diet from your health care provider, if necessary.  Regular physical exercise is one of the most important things you can do for your health. Most adults should get at least 150 minutes of moderate-intensity exercise (any activity that increases your heart rate and causes you to sweat) each week. In addition, most adults need muscle-strengthening exercises on 2 or more days a week.   Maintain a healthy weight. The body mass index (BMI) is a screening tool to identify possible weight problems. It provides an estimate of body fat based on height and weight. Your health care provider can find your BMI and can help you achieve or maintain a healthy weight. For males 20 years and older:  A BMI below 18.5 is considered underweight.  A BMI of 18.5 to 24.9 is normal.  A BMI of 25 to 29.9 is considered overweight.  A BMI of 30 and above is considered obese.  Maintain normal blood lipids and cholesterol by exercising and minimizing your intake of saturated fat. Eat a balanced diet with plenty of fruits and vegetables. Blood tests for lipids and cholesterol should begin at age 64 and be repeated every 5 years. If your lipid or cholesterol levels are high, you are over age 49, or you are at high risk for heart disease, you may need your cholesterol levels  checked more frequently.Ongoing high lipid and cholesterol levels should be treated with medicines if diet and exercise are not working.  If you smoke, find out from your health care provider how to quit. If you do not use tobacco, do not start.  Lung cancer screening is recommended for adults aged 68-80 years who are at high risk for developing lung cancer because of a history of smoking. A yearly low-dose CT scan of the lungs is recommended for people who have at least a 30-pack-year history of smoking and are current smokers or have quit within the past 15 years. A pack year of smoking is smoking an average of 1 pack of cigarettes a day for 1 year (for example, a 30-pack-year history of smoking could mean smoking 1 pack a day for 30 years or 2 packs a day for 15 years). Yearly screening should continue until the smoker has stopped smoking for at least 15 years. Yearly screening should be stopped for people who develop a health problem that would prevent them from having lung cancer treatment.  If you choose to drink alcohol, do not have more than 2 drinks per day. One drink is considered to be 12 oz (360 mL) of beer, 5 oz (150 mL) of wine, or 1.5 oz (45 mL) of liquor.  Avoid the use of street drugs. Do not share needles with anyone. Ask for help if you need support or instructions about stopping the use of drugs.  High blood pressure causes heart disease  and increases the risk of stroke. High blood pressure is more likely to develop in:  People who have blood pressure in the end of the normal range (100-139/85-89 mm Hg).  People who are overweight or obese.  People who are African American.  If you are 81-44 years of age, have your blood pressure checked every 3-5 years. If you are 50 years of age or older, have your blood pressure checked every year. You should have your blood pressure measured twice-once when you are at a hospital or clinic, and once when you are not at a hospital or clinic.  Record the average of the two measurements. To check your blood pressure when you are not at a hospital or clinic, you can use:  An automated blood pressure machine at a pharmacy.  A home blood pressure monitor.  If you are 62-47 years old, ask your health care provider if you should take aspirin to prevent heart disease.  Diabetes screening involves taking a blood sample to check your fasting blood sugar level. This should be done once every 3 years after age 51 if you are at a normal weight and without risk factors for diabetes. Testing should be considered at a younger age or be carried out more frequently if you are overweight and have at least 1 risk factor for diabetes.  Colorectal cancer can be detected and often prevented. Most routine colorectal cancer screening begins at the age of 60 and continues through age 24. However, your health care provider may recommend screening at an earlier age if you have risk factors for colon cancer. On a yearly basis, your health care provider may provide home test kits to check for hidden blood in the stool. A small camera at the end of a tube may be used to directly examine the colon (sigmoidoscopy or colonoscopy) to detect the earliest forms of colorectal cancer. Talk to your health care provider about this at age 21 when routine screening begins. A direct exam of the colon should be repeated every 5-10 years through age 37, unless early forms of precancerous polyps or small growths are found.  People who are at an increased risk for hepatitis B should be screened for this virus. You are considered at high risk for hepatitis B if:  You were born in a country where hepatitis B occurs often. Talk with your health care provider about which countries are considered high risk.  Your parents were born in a high-risk country and you have not received a shot to protect against hepatitis B (hepatitis B vaccine).  You have HIV or AIDS.  You use needles to  inject street drugs.  You live with, or have sex with, someone who has hepatitis B.  You are a man who has sex with other men (MSM).  You get hemodialysis treatment.  You take certain medicines for conditions like cancer, organ transplantation, and autoimmune conditions.  Hepatitis C blood testing is recommended for all people born from [birth year] through 1965 and any individual with known risk factors for hepatitis C.  Healthy men should no longer receive prostate-specific antigen (PSA) blood tests as part of routine cancer screening. Talk to your health care provider about prostate cancer screening.  Testicular cancer screening is not recommended for adolescents or adult males who have no symptoms. Screening includes self-exam, a health care provider exam, and other screening tests. Consult with your health care provider about any symptoms you have or any concerns you have about testicular cancer.  Practice safe sex. Use condoms and avoid high-risk sexual practices to reduce the spread of sexually transmitted infections (STIs).  You should be screened for STIs, including gonorrhea and chlamydia if:  You are sexually active and are younger than 24 years.  You are older than 24 years, and your health care provider tells you that you are at risk for this type of infection.  Your sexual activity has changed since you were last screened, and you are at an increased risk for chlamydia or gonorrhea. Ask your health care provider if you are at risk.  If you are at risk of being infected with HIV, it is recommended that you take a prescription medicine daily to prevent HIV infection. This is called pre-exposure prophylaxis (PrEP). You are considered at risk if:  You are a man who has sex with other men (MSM).  You are a heterosexual man who is sexually active with multiple partners.  You take drugs by injection.  You are sexually active with a partner who has HIV.  Talk with your health care  provider about whether you are at high risk of being infected with HIV. If you choose to begin PrEP, you should first be tested for HIV. You should then be tested every 3 months for as long as you are taking PrEP.  Use sunscreen. Apply sunscreen liberally and repeatedly throughout the day. You should seek shade when your shadow is shorter than you. Protect yourself by wearing long sleeves, pants, a wide-brimmed hat, and sunglasses year round whenever you are outdoors.  Tell your health care provider of new moles or changes in moles, especially if there is a change in shape or color. Also, tell your health care provider if a mole is larger than the size of a pencil eraser.  A one-time screening for abdominal aortic aneurysm (AAA) and surgical repair of large AAAs by ultrasound is recommended for men aged 6-75 years who are current or former smokers.  Stay current with your vaccines (immunizations). This information is not intended to replace advice given to you by your health care provider. Make sure you discuss any questions you have with your health care provider. Document Released: 09/10/2007 Document Revised: 04/04/2014 Document Reviewed: 12/16/2014 Elsevier Interactive Patient Education  2017 [employer].

## 2016-04-20 NOTE — Assessment & Plan Note (Signed)
Followed by Dr. Eulogio Ditch in [place]

## 2016-04-20 NOTE — Assessment & Plan Note (Signed)
Check lipids and glucose

## 2016-04-20 NOTE — Assessment & Plan Note (Signed)
USPSTF grade A and B recommendations reviewed with patient; age-appropriate recommendations, preventive care, screening tests, etc discussed and encouraged; healthy living encouraged; see AVS for patient education given to patient  

## 2016-04-20 NOTE — Assessment & Plan Note (Signed)
Encouraged hearing protection in workshop; refer to audiologist for evaluation
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
## 2016-04-20 NOTE — Assessment & Plan Note (Signed)
Refer to derm; suspect early [facility]

## 2016-04-20 NOTE — Assessment & Plan Note (Signed)
Will start Lyrica, 75 mg BID; explained controlled substance; decrease alcohol intake to no more than 1 glass of wine per day; he agrees
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
## 2016-04-20 NOTE — Assessment & Plan Note (Signed)
Controlled; try the DASH guidelines 

## 2016-04-20 NOTE — Assessment & Plan Note (Signed)
Follows with Dr. Nehemiah Massed; on 325 mg daily aspirin

## 2016-04-20 NOTE — Assessment & Plan Note (Signed)
Check labs 

## 2016-04-20 NOTE — Assessment & Plan Note (Signed)
Refer to GI 

## 2016-04-21 LAB — COMPLETE METABOLIC PANEL WITH GFR
ALK PHOS: 45 U/L (ref 40–115)
ALT: 20 U/L (ref 9–46)
AST: 20 U/L (ref 10–35)
Albumin: 4.1 g/dL (ref 3.6–5.1)
BUN: 22 mg/dL (ref 7–25)
CHLORIDE: 101 mmol/L (ref 98–110)
CO2: 24 mmol/L (ref 20–31)
Calcium: 9.9 mg/dL (ref 8.6–10.3)
Creat: 1.14 mg/dL (ref 0.70–1.25)
GFR, EST NON AFRICAN AMERICAN: 67 mL/min (ref >=60)
GFR, Est African American: 78 mL/min (ref >=60)
GLUCOSE: 102 mg/dL — AB (ref 65–99)
POTASSIUM: 4.1 mmol/L (ref 3.5–5.3)
SODIUM: 137 mmol/L (ref 135–146)
Total Bilirubin: 0.6 mg/dL (ref 0.2–1.2)
Total Protein: 7.1 g/dL (ref 6.1–8.1)

## 2016-04-21 LAB — LIPID PANEL
CHOL/HDL RATIO: 3.5 ratio (ref <5.0)
CHOLESTEROL: 227 mg/dL — AB (ref <200)
HDL: 64 mg/dL (ref >40)
LDL Cholesterol: 147 mg/dL — ABNORMAL HIGH (ref <100)
TRIGLYCERIDES: 82 mg/dL (ref <150)
VLDL: 16 mg/dL (ref <30)

## 2016-04-22 ENCOUNTER — Other Ambulatory Visit: Payer: Self-pay

## 2016-04-22 ENCOUNTER — Telehealth: Payer: Self-pay

## 2016-04-22 ENCOUNTER — Telehealth: Payer: Self-pay | Admitting: Family Medicine

## 2016-04-22 MED ORDER — GABAPENTIN 300 MG PO CAPS: ORAL_CAPSULE | ORAL | 0 refills | Status: DC

## 2016-04-22 NOTE — Telephone Encounter (Signed)
Patient called states the new rx for lyrica was to expensive is there anything else he could try?

## 2016-04-22 NOTE — Telephone Encounter (Signed)
Gastroenterology Pre-Procedure Review  Request Date:  Requesting Physician: Dr.   PATIENT REVIEW QUESTIONS: The patient responded to the following health history questions as indicated:    1. Are you having any GI issues?  2. Do you have a personal history of Polyps? no 3. Do you have a family history of Colon Cancer or Polyps? no 4. Diabetes Mellitus? no 5. Joint replacements in the past 12 months?no 6. Major health problems in the past 3 months?no 7. Any artificial heart valves, MVP, or defibrillator?no    MEDICATIONS & ALLERGIES:    Patient reports the following regarding taking any anticoagulation/antiplatelet therapy:   Plavix, Coumadin, Eliquis, Xarelto, Lovenox, Pradaxa, Brilinta, or Effient? no Aspirin? yes (ASA 325mg )  Patient confirms/reports the following medications:  Current Outpatient Prescriptions  Medication Sig Dispense Refill  . aspirin 325 MG tablet Take 325 mg by mouth every morning.     [name] CARTIA XT 300 MG 24 hr capsule Take 300 mg by mouth every morning.    . famotidine (PEPCID) 10 MG tablet Take 10 mg by mouth 3 (three) times a week. 3 TIMES/WEEK    . hydrochlorothiazide (HYDRODIURIL) 25 MG tablet Take 25 mg by mouth every morning.     [name] ibuprofen (ADVIL,MOTRIN) 200 MG tablet Take 800 mg by mouth every 8 (eight) hours as needed for mild pain or moderate pain.     [name] latanoprost (XALATAN) 0.005 % ophthalmic solution Place 1 drop into both eyes at bedtime.     . Loratadine (CLARITIN PO) Take 10 mg by mouth daily as needed.     [name] losartan (COZAAR) 100 MG tablet Take 100 mg by mouth every morning.    . metoprolol succinate (TOPROL XL) 25 MG 24 hr tablet Take 1 tablet (25 mg total) by mouth daily. 14 tablet 0  . prednisoLONE acetate (PRED FORTE) 1 % ophthalmic suspension Place 1 drop into the right eye 2 (two) times daily.    . pregabalin (LYRICA) 75 MG capsule Take 1 capsule (75 mg total) by mouth 2 (two) times daily. 60 capsule 2   No current facility-administered  medications for this visit.     Patient confirms/reports the following allergies:  Allergies  Allergen Reactions  . Ivp Dye [Iodinated Diagnostic Agents] Hives    No orders of the defined types were placed in this encounter.   AUTHORIZATION INFORMATION Primary Insurance: 1D#: Group #:  Secondary Insurance: 1D#: Group #:  SCHEDULE INFORMATION: Date: 05/20/16 Time: Location: [place]

## 2016-04-22 NOTE — Telephone Encounter (Signed)
Patient notified, but rx was suppose to be sent to [facility].  It was called in and the other canceled.

## 2016-04-22 NOTE — Telephone Encounter (Signed)
I took Lyrica out of the medicine list; thank you Let's get him back on gabapentin I sent in a new Rx for that He'll start with one pill at night and we'll increase it by one pill every three days and titrate it up After he gets up to three pills a day, have him give [place] a call with his response and for further instructions for tapering up

## 2016-04-26 ENCOUNTER — Other Ambulatory Visit: Payer: Self-pay | Admitting: Family Medicine

## 2016-04-26 DIAGNOSIS — E782 Mixed hyperlipidemia: Secondary | ICD-10-CM

## 2016-04-26 DIAGNOSIS — E785 Hyperlipidemia, unspecified: Secondary | ICD-10-CM | POA: Insufficient documentation

## 2016-04-26 DIAGNOSIS — Z5181 Encounter for therapeutic drug level monitoring: Secondary | ICD-10-CM

## 2016-04-26 MED ORDER — ATORVASTATIN CALCIUM 10 MG PO TABS: ORAL_TABLET | ORAL | 1 refills | Status: DC

## 2016-04-26 NOTE — Assessment & Plan Note (Signed)
Start low dose statin, every other night, recheck lipids in 6-8 weeks

## 2016-04-26 NOTE — Progress Notes (Signed)
Lab orders for June 08, 2016

## 2016-04-26 NOTE — Assessment & Plan Note (Signed)
Check sgpt in 6-8 weeks 

## 2016-04-28 NOTE — Telephone Encounter (Signed)
erro  neous encounter

## 2016-05-05 ENCOUNTER — Telehealth: Payer: Self-pay | Admitting: Family Medicine

## 2016-05-05 ENCOUNTER — Other Ambulatory Visit: Payer: Self-pay

## 2016-05-05 MED ORDER — EZETIMIBE 10 MG PO TABS: 10.0000 mg | ORAL_TABLET | Freq: Every day | ORAL | 3 refills | Status: DC

## 2016-05-05 NOTE — Telephone Encounter (Signed)
Pt.notified

## 2016-05-05 NOTE — Telephone Encounter (Signed)
Thank him for letting us know Please add atorvastatin to intolerances (leg cramps) We'll try a completely different medicine called Zetia; it is not a statin; should not cause side effects We'll recheck his cholesterol 6-8 weeks after he starts this new medicine Thank you

## 2016-05-05 NOTE — Telephone Encounter (Signed)
Pt was prescribed cholesterol medication Atorvastatin on his last visit. He was reading the side effects and it mentions muscle cramps. Pt states that he is having severe leg cramps to the point that he cannot sleep at night. Please advise. [facility]

## 2016-05-06 DIAGNOSIS — H903 Sensorineural hearing loss, bilateral: Secondary | ICD-10-CM | POA: Diagnosis not present

## 2016-05-06 DIAGNOSIS — H9313 Tinnitus, bilateral: Secondary | ICD-10-CM | POA: Diagnosis not present

## 2016-05-11 DIAGNOSIS — B0233 Zoster keratitis: Secondary | ICD-10-CM | POA: Diagnosis not present

## 2016-05-17 DIAGNOSIS — I482 Chronic atrial fibrillation: Secondary | ICD-10-CM | POA: Diagnosis not present

## 2016-05-17 DIAGNOSIS — C61 Malignant neoplasm of prostate: Secondary | ICD-10-CM | POA: Diagnosis not present

## 2016-05-17 DIAGNOSIS — I10 Essential (primary) hypertension: Secondary | ICD-10-CM | POA: Diagnosis not present

## 2016-05-20 ENCOUNTER — Encounter
Admission: RE | Disposition: A | Discharge status: Home / Self Care | Payer: Self-pay | Source: Ambulatory Visit | Attending: Gastroenterology

## 2016-05-20 ENCOUNTER — Ambulatory Visit
Admission: RE | Admit: 2016-05-20 | Discharge: 2016-05-20 | Disposition: A | Discharge status: Home / Self Care | Payer: Medicare Other | Source: Ambulatory Visit | Attending: Gastroenterology | Admitting: Gastroenterology

## 2016-05-20 ENCOUNTER — Ambulatory Visit: Payer: Medicare Other | Admitting: Anesthesiology

## 2016-05-20 DIAGNOSIS — K219 Gastro-esophageal reflux disease without esophagitis: Secondary | ICD-10-CM | POA: Diagnosis not present

## 2016-05-20 DIAGNOSIS — I10 Essential (primary) hypertension: Secondary | ICD-10-CM | POA: Diagnosis not present

## 2016-05-20 DIAGNOSIS — Z1211 Encounter for screening for malignant neoplasm of colon: Secondary | ICD-10-CM | POA: Diagnosis not present

## 2016-05-20 DIAGNOSIS — Z7982 Long term (current) use of aspirin: Secondary | ICD-10-CM | POA: Insufficient documentation

## 2016-05-20 DIAGNOSIS — Z79899 Other long term (current) drug therapy: Secondary | ICD-10-CM | POA: Insufficient documentation

## 2016-05-20 DIAGNOSIS — K635 Polyp of colon: Secondary | ICD-10-CM | POA: Diagnosis not present

## 2016-05-20 DIAGNOSIS — E785 Hyperlipidemia, unspecified: Secondary | ICD-10-CM | POA: Diagnosis not present

## 2016-05-20 DIAGNOSIS — D122 Benign neoplasm of ascending colon: Secondary | ICD-10-CM

## 2016-05-20 DIAGNOSIS — K5790 Diverticulosis of intestine, part unspecified, without perforation or abscess without bleeding: Secondary | ICD-10-CM | POA: Diagnosis not present

## 2016-05-20 DIAGNOSIS — K5730 Diverticulosis of large intestine without perforation or abscess without bleeding: Secondary | ICD-10-CM | POA: Diagnosis not present

## 2016-05-20 DIAGNOSIS — K649 Unspecified hemorrhoids: Secondary | ICD-10-CM | POA: Diagnosis not present

## 2016-05-20 DIAGNOSIS — I4891 Unspecified atrial fibrillation: Secondary | ICD-10-CM | POA: Diagnosis not present

## 2016-05-20 DIAGNOSIS — D124 Benign neoplasm of descending colon: Secondary | ICD-10-CM | POA: Diagnosis not present

## 2016-05-20 DIAGNOSIS — K640 First degree hemorrhoids: Secondary | ICD-10-CM | POA: Diagnosis not present

## 2016-05-20 HISTORY — PX: COLONOSCOPY WITH PROPOFOL: SHX5780

## 2016-05-20 SURGERY — COLONOSCOPY WITH PROPOFOL
Anesthesia: General

## 2016-05-20 MED ORDER — MIDAZOLAM HCL 5 MG/5ML IJ SOLN
INTRAMUSCULAR | Status: DC | PRN
  Administered 2016-05-20: 1 mg via INTRAVENOUS

## 2016-05-20 MED ORDER — SODIUM CHLORIDE 0.9 % IV SOLN
INTRAVENOUS | Status: DC
  Administered 2016-05-20: 1000 mL via INTRAVENOUS

## 2016-05-20 MED ORDER — METHYLENE BLUE 0.5 % INJ SOLN
INTRAVENOUS | Status: DC
Start: 2016-05-20 — End: 2016-05-20
  Filled 2016-05-20: qty 10

## 2016-05-20 MED ORDER — METOPROLOL TARTRATE 5 MG/5ML IV SOLN
INTRAVENOUS | Status: DC | PRN
  Administered 2016-05-20: 2 mg via INTRAVENOUS
  Administered 2016-05-20: 1 mg via INTRAVENOUS
  Administered 2016-05-20: 2 mg via INTRAVENOUS

## 2016-05-20 MED ORDER — SODIUM CHLORIDE 0.9 % IJ SOLN
INTRAMUSCULAR | Status: DC | PRN
  Administered 2016-05-20: 3 mL via SUBCUTANEOUS

## 2016-05-20 MED ORDER — PROPOFOL 500 MG/50ML IV EMUL
INTRAVENOUS | Status: DC | PRN
  Administered 2016-05-20: 120 ug/kg/min via INTRAVENOUS

## 2016-05-20 MED ORDER — MIDAZOLAM HCL 2 MG/2ML IJ SOLN
INTRAMUSCULAR | Status: AC
  Filled 2016-05-20: qty 2

## 2016-05-20 MED ORDER — METOPROLOL TARTARATE 1 MG/ML SYRINGE (5ML)
Status: AC
  Filled 2016-05-20: qty 5

## 2016-05-20 MED ORDER — LIDOCAINE HCL (PF) 2 % IJ SOLN
INTRAMUSCULAR | Status: AC
  Filled 2016-05-20: qty 2

## 2016-05-20 MED ORDER — FENTANYL CITRATE (PF) 100 MCG/2ML IJ SOLN
INTRAMUSCULAR | Status: DC | PRN
  Administered 2016-05-20: 50 ug via INTRAVENOUS

## 2016-05-20 MED ORDER — PROPOFOL 10 MG/ML IV BOLUS
INTRAVENOUS | Status: DC | PRN
  Administered 2016-05-20: 44.3 mg via INTRAVENOUS

## 2016-05-20 MED ORDER — FENTANYL CITRATE (PF) 100 MCG/2ML IJ SOLN
INTRAMUSCULAR | Status: AC
  Filled 2016-05-20: qty 2

## 2016-05-20 MED ORDER — PROPOFOL 500 MG/50ML IV EMUL
INTRAVENOUS | Status: AC
  Filled 2016-05-20: qty 50
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
## 2016-05-20 NOTE — Anesthesia Postprocedure Evaluation (Signed)
Anesthesia Post Note  Patient: Michael Cantrell  Procedure(s) Performed: Procedure(s) (LRB): COLONOSCOPY WITH PROPOFOL (N/A)  Patient location during evaluation: Endoscopy Anesthesia Type: General Level of consciousness: awake and alert Pain management: pain level controlled Vital Signs Assessment: post-procedure vital signs reviewed and stable Respiratory status: spontaneous breathing and respiratory function stable Cardiovascular status: stable Anesthetic complications: no     Last Vitals:  Vitals:   05/20/16 1047 05/20/16 1225  BP: 129/85 93/67  Pulse: 97 (!) 123  Resp: 16 16  Temp: 36.3 C (!) 35.8 C    Last Pain:  Vitals:   05/20/16 1225  TempSrc: Tympanic                 KEPHART,WILLIAM K

## 2016-05-20 NOTE — Anesthesia Preprocedure Evaluation (Addendum)
Anesthesia Evaluation  Patient identified by MRN, date of birth, ID band Patient awake    Reviewed: Allergy & Precautions, NPO status , Patient's Chart, lab work & pertinent test results  History of Anesthesia Complications Negative for: history of anesthetic complications  Airway Mallampati: III       Dental   Pulmonary neg pulmonary ROS,           Cardiovascular hypertension, Pt. on medications and Pt. on home beta blockers (-) Past MI and (-) CHF + dysrhythmias Atrial Fibrillation (-) Valvular Problems/Murmurs     Neuro/Psych  Neuromuscular disease (shingles)    GI/Hepatic Neg liver ROS, GERD  Medicated and Controlled,  Endo/Other  negative endocrine ROS  Renal/GU Renal disease (stones)     Musculoskeletal   Abdominal   Peds  Hematology negative hematology ROS (+)   Anesthesia Other Findings   Reproductive/Obstetrics                            Anesthesia Physical Anesthesia Plan  ASA: III  Anesthesia Plan: General   Post-op Pain Management:    Induction: Intravenous  Airway Management Planned: Nasal Cannula  Additional Equipment:   Intra-op Plan:   Post-operative Plan:   Informed Consent: I have reviewed the patients History and Physical, chart, labs and discussed the procedure including the risks, benefits and alternatives for the proposed anesthesia with the patient or authorized representative who has indicated his/her understanding and acceptance.     Plan Discussed with:   Anesthesia Plan Comments:         Anesthesia Quick Evaluation

## 2016-05-20 NOTE — Transfer of Care (Signed)
Immediate Anesthesia Transfer of Care Note  Patient: Michael Cantrell  Procedure(s) Performed: Procedure(s): COLONOSCOPY WITH PROPOFOL (N/A)  Patient Location: PACU and Endoscopy Unit  Anesthesia Type:General  Level of Consciousness: sedated  Airway & Oxygen Therapy: Patient Spontanous Breathing and Patient connected to nasal cannula oxygen  Post-op Assessment: Report given to RN and Post -op Vital signs reviewed and stable  Post vital signs: stable  Last Vitals:  Vitals:   05/20/16 1047 05/20/16 1225  BP: 129/85 93/67  Pulse: 97 (!) 123  Resp: 16 16  Temp: 36.3 C (!) 35.8 C    Last Pain:  Vitals:   05/20/16 1225  TempSrc: Tympanic         Complications: No apparent anesthesia complications

## 2016-05-20 NOTE — Op Note (Signed)
[facility] Gastroenterology Patient Name: Michael Cantrell Procedure Date: 05/20/2016 11:43 AM MRN: [id number] Account #: [id number] Date of Birth: [birth year] Admit Type: Outpatient Age: 66 Room: [facility] ENDO ROOM 4 Gender: Male Note Status: Finalized Procedure:            Colonoscopy Indications:          Screening for colorectal malignant neoplasm Providers:            Jonathon Bellows MD, MD Medicines:            Monitored Anesthesia Care Complications:        No immediate complications. Procedure:            Pre-Anesthesia Assessment:                       - Prior to the procedure, a History and Physical was                        performed, and patient medications, allergies and                        sensitivities were reviewed. The patient's tolerance of                        previous anesthesia was reviewed.                       - The risks and benefits of the procedure and the                        sedation options and risks were discussed with the                        patient. All questions were answered and informed                        consent was obtained.                       - ASA Grade Assessment: III - A patient with severe                        systemic disease.                       - After reviewing the risks and benefits, the patient                        was deemed in satisfactory condition to undergo the                        procedure.                       After obtaining informed consent, the colonoscope was                        passed under direct vision. Throughout the procedure,                        the patient's blood pressure, pulse, and oxygen  saturations were monitored continuously. The                        Colonoscope was introduced through the anus and                        advanced to the the cecum, identified by the                        appendiceal orifice, IC valve and transillumination.                   The colonoscopy was performed with ease. The patient                        tolerated the procedure well. The quality of the bowel                        preparation was excellent. Findings:      A 5 mm polyp was found in the ascending colon. The polyp was sessile.       The polyp was removed with a cold biopsy forceps. Resection and       retrieval were complete.      A 5 mm polyp was found in the descending colon. The polyp was sessile.       The polyp was removed with a cold biopsy forceps. Resection and       retrieval were complete.      A 12 mm polyp was found in the descending colon. The polyp was flat. The       polyp was removed with a saline injection-lift technique using a hot       snare. Resection and retrieval were complete. To prevent bleeding after       the polypectomy, two hemostatic clips were successfully placed. There       was no bleeding during the maneuver.      Multiple medium-mouthed diverticula were found in the sigmoid colon.      Internal hemorrhoids were found during retroflexion. The hemorrhoids       were Grade I (internal hemorrhoids that do not prolapse). Impression:           - One 5 mm polyp in the ascending colon, removed with a                        cold biopsy forceps. Resected and retrieved.                       - One 5 mm polyp in the descending colon, removed with                        a cold biopsy forceps. Resected and retrieved.                       - One 12 mm polyp in the descending colon, removed                        using injection-lift and a hot snare. Resected and                        retrieved. Clips were placed.                       -  Diverticulosis in the sigmoid colon.                       - Internal hemorrhoids. Recommendation:       - Discharge patient to home (with escort).                       - Resume previous diet.                       - Continue present medications.                       - Await  pathology results.                       - Repeat colonoscopy in 3 - 5 years for surveillance                        based on pathology results. Procedure Code(s):    --- Professional ---                       [phone], Colonoscopy, flexible; with removal of tumor(s),                        polyp(s), or other lesion(s) by snare technique                       45380, 39, Colonoscopy, flexible; with biopsy, single                        or multiple                       45381, Colonoscopy, flexible; with directed submucosal                        injection(s), any substance Diagnosis Code(s):    --- Professional ---                       Z12.11, Encounter for screening for malignant neoplasm                        of colon                       D12.2, Benign neoplasm of ascending colon                       D12.4, Benign neoplasm of descending colon                       K64.0, First degree hemorrhoids                       K57.30, Diverticulosis of large intestine without                        perforation or abscess without bleeding CPT copyright 2016 American Medical Association. All rights reserved. The codes documented in this report are preliminary and upon coder review may  be revised to meet current compliance requirements. Jonathon Bellows, MD Jonathon Bellows MD, MD 05/20/2016 12:27:00 PM This report has  been signed electronically. Number of Addenda: 0 Note Initiated On: 05/20/2016 11:43 AM Scope Withdrawal Time: 0 hours 27 minutes 57 seconds  Total Procedure Duration: 0 hours 32 minutes 56 seconds       [facility]

## 2016-05-20 NOTE — Anesthesia Post-op Follow-up Note (Signed)
Anesthesia QCDR form completed.        

## 2016-05-20 NOTE — Brief Op Note (Signed)
Saline lift with methyline blue added. DC hot snare polypectomy site

## 2016-05-23 ENCOUNTER — Encounter: Payer: Self-pay | Admitting: Gastroenterology

## 2016-05-24 ENCOUNTER — Encounter: Payer: Self-pay | Admitting: Gastroenterology

## 2016-05-24 LAB — SURGICAL PATHOLOGY

## 2016-05-27 ENCOUNTER — Other Ambulatory Visit: Payer: Self-pay

## 2016-05-27 DIAGNOSIS — B0229 Other postherpetic nervous system involvement: Secondary | ICD-10-CM

## 2016-05-27 MED ORDER — GABAPENTIN 600 MG PO TABS: ORAL_TABLET | ORAL | 2 refills | Status: DC

## 2016-05-27 NOTE — Telephone Encounter (Signed)
Patient already notified about directions

## 2016-05-27 NOTE — Telephone Encounter (Signed)
Pt stated he is in severe pain. Pt stated he follow the instructions on the bottle.

## 2016-05-27 NOTE — Telephone Encounter (Signed)
I'm sorry to hear he's still in pain I just sent in a prescription for the 600 mg strength Have him taper up as follows: For 3 days: 300 mg in the morning, 300 mg in the afternoon, and 600 mg at night For the next 3 days, 600 mg in the morning, 300 mg in the afternoon, and 600 mg at night After: 600 mg three times a day We'll refer him to the pain clinic since he's having such severe pain and they can consider a nerve block

## 2016-05-27 NOTE — Telephone Encounter (Signed)
Pt is notified. Ask patient to write down the instructions so we can make sure its correct. Pt understood and wrote down the instructions. Asked patient to read it out to me. Its confirmed.

## 2016-05-30 ENCOUNTER — Other Ambulatory Visit: Payer: Self-pay | Admitting: Urology

## 2016-05-30 DIAGNOSIS — N5201 Erectile dysfunction due to arterial insufficiency: Secondary | ICD-10-CM | POA: Diagnosis not present

## 2016-05-30 DIAGNOSIS — R9721 Rising PSA following treatment for malignant neoplasm of prostate: Secondary | ICD-10-CM | POA: Diagnosis not present

## 2016-05-30 DIAGNOSIS — C61 Malignant neoplasm of prostate: Secondary | ICD-10-CM

## 2016-06-01 DIAGNOSIS — B0233 Zoster keratitis: Secondary | ICD-10-CM | POA: Diagnosis not present

## 2016-06-07 DIAGNOSIS — B0233 Zoster keratitis: Secondary | ICD-10-CM | POA: Diagnosis not present

## 2016-06-08 ENCOUNTER — Encounter (HOSPITAL_COMMUNITY)
Admission: RE | Admit: 2016-06-08 | Discharge: 2016-06-08 | Disposition: A | Discharge status: Home / Self Care | Payer: Medicare Other | Source: Ambulatory Visit | Attending: Urology | Admitting: Urology

## 2016-06-08 DIAGNOSIS — K5730 Diverticulosis of large intestine without perforation or abscess without bleeding: Secondary | ICD-10-CM | POA: Diagnosis not present

## 2016-06-08 DIAGNOSIS — C61 Malignant neoplasm of prostate: Secondary | ICD-10-CM | POA: Diagnosis not present

## 2016-06-08 MED ORDER — TECHNETIUM TC 99M MEDRONATE IV KIT
21.0000 | PACK | Freq: Once | INTRAVENOUS | Status: AC | PRN
  Administered 2016-06-08: 21 via INTRAVENOUS

## 2016-06-10 DIAGNOSIS — B0233 Zoster keratitis: Secondary | ICD-10-CM | POA: Diagnosis not present

## 2016-06-14 DIAGNOSIS — B0233 Zoster keratitis: Secondary | ICD-10-CM | POA: Diagnosis not present

## 2016-06-21 DIAGNOSIS — B0233 Zoster keratitis: Secondary | ICD-10-CM | POA: Diagnosis not present

## 2016-06-28 DIAGNOSIS — B0233 Zoster keratitis: Secondary | ICD-10-CM | POA: Diagnosis not present

## 2016-07-01 DIAGNOSIS — I872 Venous insufficiency (chronic) (peripheral): Secondary | ICD-10-CM | POA: Insufficient documentation

## 2016-07-01 DIAGNOSIS — I482 Chronic atrial fibrillation: Secondary | ICD-10-CM | POA: Diagnosis not present

## 2016-07-01 DIAGNOSIS — I10 Essential (primary) hypertension: Secondary | ICD-10-CM | POA: Diagnosis not present

## 2016-07-01 DIAGNOSIS — R21 Rash and other nonspecific skin eruption: Secondary | ICD-10-CM | POA: Diagnosis not present

## 2016-08-02 DIAGNOSIS — B0233 Zoster keratitis: Secondary | ICD-10-CM | POA: Diagnosis not present

## 2016-08-10 ENCOUNTER — Ambulatory Visit: Payer: Medicare Other | Admitting: Family Medicine

## 2016-08-12 ENCOUNTER — Ambulatory Visit (INDEPENDENT_AMBULATORY_CARE_PROVIDER_SITE_OTHER): Payer: Medicare Other | Admitting: Family Medicine

## 2016-08-12 ENCOUNTER — Encounter: Payer: Self-pay | Admitting: Family Medicine

## 2016-08-12 VITALS — BP 122/74 | HR 96 | Temp 97.8°F | Resp 16 | Wt 195.3 lb

## 2016-08-12 DIAGNOSIS — B0229 Other postherpetic nervous system involvement: Secondary | ICD-10-CM

## 2016-08-12 DIAGNOSIS — I482 Chronic atrial fibrillation, unspecified: Secondary | ICD-10-CM

## 2016-08-12 DIAGNOSIS — Z23 Encounter for immunization: Secondary | ICD-10-CM

## 2016-08-12 DIAGNOSIS — R739 Hyperglycemia, unspecified: Secondary | ICD-10-CM

## 2016-08-12 DIAGNOSIS — C61 Malignant neoplasm of prostate: Secondary | ICD-10-CM

## 2016-08-12 DIAGNOSIS — I10 Essential (primary) hypertension: Secondary | ICD-10-CM

## 2016-08-12 DIAGNOSIS — E782 Mixed hyperlipidemia: Secondary | ICD-10-CM | POA: Diagnosis not present

## 2016-08-12 DIAGNOSIS — B029 Zoster without complications: Secondary | ICD-10-CM | POA: Diagnosis not present

## 2016-08-12 NOTE — Assessment & Plan Note (Signed)
Managed by urologist; will get PSA checked in 3 weeks
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
## 2016-08-12 NOTE — Assessment & Plan Note (Signed)
Managed by cardiologist; rate controlled today 

## 2016-08-12 NOTE — Assessment & Plan Note (Signed)
Check lipids today, fasting; could not tolerate atorvastatin; now on no medicines; limit saturated fats

## 2016-08-12 NOTE — Assessment & Plan Note (Signed)
Check fasting glucose today and A1c; limit white bread and sugary drinks

## 2016-08-12 NOTE — Patient Instructions (Addendum)
Ask your eye doctor if and when it will be okay for you to have the Shingrix vaccine Try to limit saturated fats in your diet (bologna, hot dogs, barbeque, cheeseburgers, hamburgers, steak, bacon, sausage, cheese, etc.) and get more fresh fruits, vegetables, and whole grains Limit sugary drink and white bread Try to lose 10 pounds before I see you again  DASH Eating Plan DASH stands for "Dietary Approaches to Stop Hypertension." The DASH eating plan is a healthy eating plan that has been shown to reduce high blood pressure (hypertension). It may also reduce your risk for type 2 diabetes, heart disease, and stroke. The DASH eating plan may also help with weight loss. What are tips for following this plan? General guidelines   Avoid eating more than 2,300 mg (milligrams) of salt (sodium) a day. If you have hypertension, you may need to reduce your sodium intake to 1,500 mg a day.  Limit alcohol intake to no more than 1 drink a day for nonpregnant women and 2 drinks a day for men. One drink equals 12 oz of beer, 5 oz of wine, or 1 oz of hard liquor.  Work with your health care provider to maintain a healthy body weight or to lose weight. Ask what an ideal weight is for you.  Get at least 30 minutes of exercise that causes your heart to beat faster (aerobic exercise) most days of the week. Activities may include walking, swimming, or biking.  Work with your health care provider or diet and nutrition specialist (dietitian) to adjust your eating plan to your individual calorie needs. Reading food labels   Check food labels for the amount of sodium per serving. Choose foods with less than 5 percent of the Daily Value of sodium. Generally, foods with less than 300 mg of sodium per serving fit into this eating plan.  To find whole grains, look for the word "whole" as the first word in the ingredient list. Shopping   Buy products labeled as "low-sodium" or "no salt added."  Buy fresh foods. Avoid  canned foods and premade or frozen meals. Cooking   Avoid adding salt when cooking. Use salt-free seasonings or herbs instead of table salt or sea salt. Check with your health care provider or pharmacist before using salt substitutes.  Do not fry foods. Cook foods using healthy methods such as baking, boiling, grilling, and broiling instead.  Cook with heart-healthy oils, such as olive, canola, soybean, or sunflower oil. Meal planning    Eat a balanced diet that includes:  5 or more servings of fruits and vegetables each day. At each meal, try to fill half of your plate with fruits and vegetables.  Up to 6-8 servings of whole grains each day.  Less than 6 oz of lean meat, poultry, or fish each day. A 3-oz serving of meat is about the same size as a deck of cards. One egg equals 1 oz.  2 servings of low-fat dairy each day.  A serving of nuts, seeds, or beans 5 times each week.  Heart-healthy fats. Healthy fats called Omega-3 fatty acids are found in foods such as flaxseeds and coldwater fish, like sardines, salmon, and mackerel.  Limit how much you eat of the following:  Canned or prepackaged foods.  Food that is high in trans fat, such as fried foods.  Food that is high in saturated fat, such as fatty meat.  Sweets, desserts, sugary drinks, and other foods with added sugar.  Full-fat dairy products.  Do not salt foods before eating.  Try to eat at least 2 vegetarian meals each week.  Eat more home-cooked food and less restaurant, buffet, and fast food.  When eating at a restaurant, ask that your food be prepared with less salt or no salt, if possible. What foods are recommended? The items listed may not be a complete list. Talk with your dietitian about what dietary choices are best for you. Grains  Whole-grain or whole-wheat bread. Whole-grain or whole-wheat pasta. Brown rice. [place]. Bulgur. Whole-grain and low-sodium cereals. Pita bread. Low-fat, low-sodium  crackers. Whole-wheat flour tortillas. Vegetables  Fresh or frozen vegetables (raw, steamed, roasted, or grilled). Low-sodium or reduced-sodium tomato and vegetable juice. Low-sodium or reduced-sodium tomato sauce and tomato paste. Low-sodium or reduced-sodium canned vegetables. Fruits  All fresh, dried, or frozen fruit. Canned fruit in natural juice (without added sugar). Meat and other protein foods  Skinless chicken or [place]. Ground chicken or [place]. Pork with fat trimmed off. Fish and seafood. Egg whites. Dried beans, peas, or lentils. Unsalted nuts, nut butters, and seeds. Unsalted canned beans. Lean cuts of beef with fat trimmed off. Low-sodium, lean deli meat. Dairy  Low-fat (1%) or fat-free (skim) milk. Fat-free, low-fat, or reduced-fat cheeses. Nonfat, low-sodium ricotta or cottage cheese. Low-fat or nonfat yogurt. Low-fat, low-sodium cheese. Fats and oils  Soft margarine without trans fats. Vegetable oil. Low-fat, reduced-fat, or light mayonnaise and salad dressings (reduced-sodium). Canola, safflower, olive, soybean, and sunflower oils. Avocado. Seasoning and other foods  Herbs. Spices. Seasoning mixes without salt. Unsalted popcorn and pretzels. Fat-free sweets. What foods are not recommended? The items listed may not be a complete list. Talk with your dietitian about what dietary choices are best for you. Grains  Baked goods made with fat, such as croissants, muffins, or some breads. Dry pasta or rice meal packs. Vegetables  Creamed or fried vegetables. Vegetables in a cheese sauce. Regular canned vegetables (not low-sodium or reduced-sodium). Regular canned tomato sauce and paste (not low-sodium or reduced-sodium). Regular tomato and vegetable juice (not low-sodium or reduced-sodium). Angie Fava. Olives. Fruits  Canned fruit in a light or heavy syrup. Fried fruit. Fruit in cream or butter sauce. Meat and other protein foods  Fatty cuts of meat. Ribs. Fried meat. Berniece Salines. Sausage.  Bologna and other processed lunch meats. Salami. Fatback. Hotdogs. Bratwurst. Salted nuts and seeds. Canned beans with added salt. Canned or smoked fish. Whole eggs or egg yolks. Chicken or [place] with skin. Dairy  Whole or 2% milk, cream, and half-and-half. Whole or full-fat cream cheese. Whole-fat or sweetened yogurt. Full-fat cheese. Nondairy creamers. Whipped toppings. Processed cheese and cheese spreads. Fats and oils  Butter. Stick margarine. Lard. Shortening. Ghee. Bacon fat. Tropical oils, such as coconut, palm kernel, or palm oil. Seasoning and other foods  Salted popcorn and pretzels. Onion salt, garlic salt, seasoned salt, table salt, and sea salt. Worcestershire sauce. Tartar sauce. Barbecue sauce. Teriyaki sauce. Soy sauce, including reduced-sodium. Steak sauce. Canned and packaged gravies. Fish sauce. Oyster sauce. Cocktail sauce. Horseradish that you find on the shelf. Ketchup. Mustard. Meat flavorings and tenderizers. Bouillon cubes. Hot sauce and Tabasco sauce. Premade or packaged marinades. Premade or packaged taco seasonings. Relishes. Regular salad dressings. Where to find more information:  National Heart, Lung, and [place]: https://wilson-eaton.com/  American Heart Association: www.heart.org Summary  The DASH eating plan is a healthy eating plan that has been shown to reduce high blood pressure (hypertension). It may also reduce your risk for type 2 diabetes, heart  disease, and stroke.  With the DASH eating plan, you should limit salt (sodium) intake to 2,300 mg a day. If you have hypertension, you may need to reduce your sodium intake to 1,500 mg a day.  When on the DASH eating plan, aim to eat more fresh fruits and vegetables, whole grains, lean proteins, low-fat dairy, and heart-healthy fats.  Work with your health care provider or diet and nutrition specialist (dietitian) to adjust your eating plan to your individual calorie needs. This information is not intended to  replace advice given to you by your health care provider. Make sure you discuss any questions you have with your health care provider. Document Released: 03/03/2011 Document Revised: 03/07/2016 Document Reviewed: 03/07/2016 Elsevier Interactive Patient Education  2017 [employer].

## 2016-08-12 NOTE — Assessment & Plan Note (Signed)
Still with some pain, limited gabapentin use; seeing ophthalmologist

## 2016-08-12 NOTE — Assessment & Plan Note (Signed)
Well-controlled; try to follow DASH guidelines 

## 2016-08-12 NOTE — Assessment & Plan Note (Signed)
Continue intermittent gabapentin; patient happy with current regimen, just call for change of dose if desired

## 2016-08-13 ENCOUNTER — Telehealth: Payer: Self-pay | Admitting: Family Medicine

## 2016-08-13 DIAGNOSIS — N289 Disorder of kidney and ureter, unspecified: Secondary | ICD-10-CM | POA: Insufficient documentation

## 2016-08-13 LAB — HEMOGLOBIN A1C
ESTIMATED AVERAGE GLUCOSE: 114 mg/dL
HEMOGLOBIN A1C: 5.6 % (ref 4.8–5.6)

## 2016-08-13 LAB — LIPID PANEL
CHOLESTEROL TOTAL: 255 mg/dL — AB (ref 100–199)
Chol/HDL Ratio: 4.3 ratio (ref 0.0–5.0)
HDL: 59 mg/dL (ref >39)
LDL Calculated: 174 mg/dL — ABNORMAL HIGH (ref 0–99)
TRIGLYCERIDES: 109 mg/dL (ref 0–149)
VLDL CHOLESTEROL CAL: 22 mg/dL (ref 5–40)

## 2016-08-13 LAB — BASIC METABOLIC PANEL
BUN / CREAT RATIO: 21 (ref 10–24)
BUN: 31 mg/dL — ABNORMAL HIGH (ref 8–27)
CO2: 26 mmol/L (ref 18–29)
CREATININE: 1.51 mg/dL — AB (ref 0.76–1.27)
Calcium: 10.5 mg/dL — ABNORMAL HIGH (ref 8.6–10.2)
Chloride: 99 mmol/L (ref 96–106)
GFR, EST AFRICAN AMERICAN: 55 mL/min/{1.73_m2} — AB (ref >59)
GFR, EST NON AFRICAN AMERICAN: 48 mL/min/{1.73_m2} — AB (ref >59)
Glucose: 108 mg/dL — ABNORMAL HIGH (ref 65–99)
Potassium: 5.3 mmol/L — ABNORMAL HIGH (ref 3.5–5.2)
SODIUM: 141 mmol/L (ref 134–144)

## 2016-08-13 MED ORDER — HYDROCHLOROTHIAZIDE 25 MG PO TABS: 12.5000 mg | ORAL_TABLET | Freq: Every day | ORAL | 1 refills | Status: DC

## 2016-08-13 MED ORDER — EZETIMIBE 10 MG PO TABS: 10.0000 mg | ORAL_TABLET | Freq: Every day | ORAL | 3 refills | Status: DC

## 2016-08-13 NOTE — Telephone Encounter (Signed)
I spoke with patient about labs Hydrate, 64 ounces of water a day Avoid NSAIDs Return Monday for non-fasting bmp Decrease HCTZ to half of a pill  Recheck BP on Monday when he comes in for bmp check He does not tolerate statins Start zetia Really work on weight loss and DASH guidelines

## 2016-08-13 NOTE — Assessment & Plan Note (Signed)
Drop in GFR; decrease HCTZ, hydration, avoid NSAIDs; recheck Monday

## 2016-08-15 ENCOUNTER — Ambulatory Visit: Payer: Medicare Other

## 2016-08-15 DIAGNOSIS — N289 Disorder of kidney and ureter, unspecified: Secondary | ICD-10-CM | POA: Diagnosis not present

## 2016-08-15 DIAGNOSIS — I10 Essential (primary) hypertension: Secondary | ICD-10-CM

## 2016-08-15 NOTE — Addendum Note (Signed)
Addended by: Docia Furl on: 08/15/2016 10:21 AM   Modules accepted: Orders

## 2016-08-16 ENCOUNTER — Other Ambulatory Visit: Payer: Self-pay

## 2016-08-16 DIAGNOSIS — N289 Disorder of kidney and ureter, unspecified: Secondary | ICD-10-CM

## 2016-08-16 LAB — BASIC METABOLIC PANEL
BUN/Creatinine Ratio: 20 (ref 10–24)
BUN: 27 mg/dL (ref 8–27)
CO2: 20 mmol/L (ref 18–29)
Calcium: 9.8 mg/dL (ref 8.6–10.2)
Chloride: 101 mmol/L (ref 96–106)
Creatinine, Ser: 1.38 mg/dL — ABNORMAL HIGH (ref 0.76–1.27)
GFR, EST AFRICAN AMERICAN: 62 mL/min/{1.73_m2} (ref >59)
GFR, EST NON AFRICAN AMERICAN: 53 mL/min/{1.73_m2} — AB (ref >59)
Glucose: 104 mg/dL — ABNORMAL HIGH (ref 65–99)
Potassium: 4.8 mmol/L (ref 3.5–5.2)
SODIUM: 138 mmol/L (ref 134–144)

## 2016-08-17 DIAGNOSIS — B0233 Zoster keratitis: Secondary | ICD-10-CM | POA: Diagnosis not present

## 2016-08-30 DIAGNOSIS — B0233 Zoster keratitis: Secondary | ICD-10-CM | POA: Diagnosis not present

## 2016-08-31 DIAGNOSIS — I482 Chronic atrial fibrillation: Secondary | ICD-10-CM | POA: Diagnosis not present

## 2016-08-31 DIAGNOSIS — I10 Essential (primary) hypertension: Secondary | ICD-10-CM | POA: Diagnosis not present

## 2016-09-01 DIAGNOSIS — B0232 Zoster iridocyclitis: Secondary | ICD-10-CM | POA: Diagnosis not present

## 2016-09-01 DIAGNOSIS — H40003 Preglaucoma, unspecified, bilateral: Secondary | ICD-10-CM | POA: Diagnosis not present

## 2016-09-02 DIAGNOSIS — B0230 Zoster ocular disease, unspecified: Secondary | ICD-10-CM | POA: Diagnosis not present

## 2016-09-06 DIAGNOSIS — B0233 Zoster keratitis: Secondary | ICD-10-CM | POA: Diagnosis not present

## 2016-09-16 DIAGNOSIS — B0233 Zoster keratitis: Secondary | ICD-10-CM | POA: Diagnosis not present

## 2016-09-20 DIAGNOSIS — B0232 Zoster iridocyclitis: Secondary | ICD-10-CM | POA: Diagnosis not present

## 2016-09-29 ENCOUNTER — Other Ambulatory Visit: Payer: Self-pay

## 2016-09-29 DIAGNOSIS — C61 Malignant neoplasm of prostate: Secondary | ICD-10-CM | POA: Diagnosis not present

## 2016-09-29 DIAGNOSIS — N289 Disorder of kidney and ureter, unspecified: Secondary | ICD-10-CM

## 2016-09-30 LAB — BASIC METABOLIC PANEL
BUN / CREAT RATIO: 24 (ref 10–24)
BUN: 34 mg/dL — AB (ref 8–27)
CALCIUM: 9.7 mg/dL (ref 8.6–10.2)
CO2: 21 mmol/L (ref 20–29)
CREATININE: 1.42 mg/dL — AB (ref 0.76–1.27)
Chloride: 101 mmol/L (ref 96–106)
GFR calc non Af Amer: 51 mL/min/{1.73_m2} — ABNORMAL LOW (ref >59)
GFR, EST AFRICAN AMERICAN: 59 mL/min/{1.73_m2} — AB (ref >59)
Glucose: 93 mg/dL (ref 65–99)
Potassium: 4.6 mmol/L (ref 3.5–5.2)
Sodium: 138 mmol/L (ref 134–144)

## 2016-10-04 ENCOUNTER — Other Ambulatory Visit: Payer: Self-pay

## 2016-10-04 DIAGNOSIS — N289 Disorder of kidney and ureter, unspecified: Secondary | ICD-10-CM

## 2016-10-04 DIAGNOSIS — B0233 Zoster keratitis: Secondary | ICD-10-CM | POA: Diagnosis not present

## 2016-10-10 DIAGNOSIS — R9721 Rising PSA following treatment for malignant neoplasm of prostate: Secondary | ICD-10-CM | POA: Diagnosis not present

## 2016-10-10 DIAGNOSIS — C61 Malignant neoplasm of prostate: Secondary | ICD-10-CM | POA: Diagnosis not present

## 2016-10-10 DIAGNOSIS — N393 Stress incontinence (female) (male): Secondary | ICD-10-CM | POA: Diagnosis not present

## 2016-10-10 DIAGNOSIS — N5201 Erectile dysfunction due to arterial insufficiency: Secondary | ICD-10-CM | POA: Diagnosis not present

## 2016-10-11 DIAGNOSIS — B0233 Zoster keratitis: Secondary | ICD-10-CM | POA: Diagnosis not present

## 2016-10-25 DIAGNOSIS — B0233 Zoster keratitis: Secondary | ICD-10-CM | POA: Diagnosis not present

## 2016-11-07 ENCOUNTER — Other Ambulatory Visit: Payer: Self-pay

## 2016-11-07 DIAGNOSIS — E782 Mixed hyperlipidemia: Secondary | ICD-10-CM

## 2016-11-07 LAB — LIPID PANEL
CHOL/HDL RATIO: 3.2 ratio (ref <5.0)
CHOLESTEROL: 208 mg/dL — AB (ref <200)
HDL: 65 mg/dL (ref >40)
LDL Cholesterol: 123 mg/dL — ABNORMAL HIGH (ref <100)
TRIGLYCERIDES: 99 mg/dL (ref <150)
VLDL: 20 mg/dL (ref <30)

## 2016-11-17 DIAGNOSIS — B0233 Zoster keratitis: Secondary | ICD-10-CM | POA: Diagnosis not present

## 2016-12-01 DIAGNOSIS — H40003 Preglaucoma, unspecified, bilateral: Secondary | ICD-10-CM | POA: Diagnosis not present

## 2016-12-28 DIAGNOSIS — E782 Mixed hyperlipidemia: Secondary | ICD-10-CM | POA: Diagnosis not present

## 2016-12-28 DIAGNOSIS — I10 Essential (primary) hypertension: Secondary | ICD-10-CM | POA: Diagnosis not present

## 2016-12-28 DIAGNOSIS — B0232 Zoster iridocyclitis: Secondary | ICD-10-CM | POA: Diagnosis not present

## 2016-12-28 DIAGNOSIS — R42 Dizziness and giddiness: Secondary | ICD-10-CM | POA: Diagnosis not present

## 2016-12-28 DIAGNOSIS — I482 Chronic atrial fibrillation: Secondary | ICD-10-CM | POA: Diagnosis not present

## 2016-12-28 DIAGNOSIS — I6523 Occlusion and stenosis of bilateral carotid arteries: Secondary | ICD-10-CM | POA: Diagnosis not present

## 2017-01-11 DIAGNOSIS — M9901 Segmental and somatic dysfunction of cervical region: Secondary | ICD-10-CM | POA: Diagnosis not present

## 2017-01-11 DIAGNOSIS — M531 Cervicobrachial syndrome: Secondary | ICD-10-CM | POA: Diagnosis not present

## 2017-01-16 DIAGNOSIS — M531 Cervicobrachial syndrome: Secondary | ICD-10-CM | POA: Diagnosis not present

## 2017-01-16 DIAGNOSIS — M9901 Segmental and somatic dysfunction of cervical region: Secondary | ICD-10-CM | POA: Diagnosis not present

## 2017-01-16 DIAGNOSIS — M9903 Segmental and somatic dysfunction of lumbar region: Secondary | ICD-10-CM | POA: Diagnosis not present

## 2017-01-16 DIAGNOSIS — M5386 Other specified dorsopathies, lumbar region: Secondary | ICD-10-CM | POA: Diagnosis not present

## 2017-01-20 DIAGNOSIS — C61 Malignant neoplasm of prostate: Secondary | ICD-10-CM | POA: Diagnosis not present

## 2017-01-25 DIAGNOSIS — S68519A Complete traumatic transphalangeal amputation of unspecified thumb, initial encounter: Secondary | ICD-10-CM | POA: Diagnosis not present

## 2017-01-25 DIAGNOSIS — S68022A Partial traumatic metacarpophalangeal amputation of left thumb, initial encounter: Secondary | ICD-10-CM | POA: Diagnosis not present

## 2017-01-25 DIAGNOSIS — W312XXA Contact with powered woodworking and forming machines, initial encounter: Secondary | ICD-10-CM | POA: Diagnosis not present

## 2017-01-25 DIAGNOSIS — Y33XXXA Other specified events, undetermined intent, initial encounter: Secondary | ICD-10-CM | POA: Diagnosis not present

## 2017-01-25 DIAGNOSIS — S6992XA Unspecified injury of left wrist, hand and finger(s), initial encounter: Secondary | ICD-10-CM | POA: Diagnosis not present

## 2017-01-25 DIAGNOSIS — S68522A Partial traumatic transphalangeal amputation of left thumb, initial encounter: Secondary | ICD-10-CM | POA: Diagnosis not present

## 2017-01-25 HISTORY — PX: AMPUTATION FINGER / THUMB: SUR24

## 2017-02-02 DIAGNOSIS — S68522A Partial traumatic transphalangeal amputation of left thumb, initial encounter: Secondary | ICD-10-CM | POA: Insufficient documentation

## 2017-02-07 DIAGNOSIS — S68522A Partial traumatic transphalangeal amputation of left thumb, initial encounter: Secondary | ICD-10-CM | POA: Diagnosis not present

## 2017-02-08 DIAGNOSIS — B0232 Zoster iridocyclitis: Secondary | ICD-10-CM | POA: Diagnosis not present

## 2017-02-10 ENCOUNTER — Ambulatory Visit: Payer: Medicare Other | Admitting: Family Medicine

## 2017-02-22 DIAGNOSIS — B0232 Zoster iridocyclitis: Secondary | ICD-10-CM | POA: Diagnosis not present

## 2017-03-24 DIAGNOSIS — H40003 Preglaucoma, unspecified, bilateral: Secondary | ICD-10-CM | POA: Diagnosis not present

## 2017-03-30 ENCOUNTER — Ambulatory Visit (INDEPENDENT_AMBULATORY_CARE_PROVIDER_SITE_OTHER): Payer: Medicare Other | Admitting: Family Medicine

## 2017-03-30 ENCOUNTER — Encounter: Payer: Self-pay | Admitting: Family Medicine

## 2017-03-30 DIAGNOSIS — C61 Malignant neoplasm of prostate: Secondary | ICD-10-CM | POA: Diagnosis not present

## 2017-03-30 DIAGNOSIS — E782 Mixed hyperlipidemia: Secondary | ICD-10-CM | POA: Diagnosis not present

## 2017-03-30 DIAGNOSIS — I10 Essential (primary) hypertension: Secondary | ICD-10-CM | POA: Diagnosis not present

## 2017-03-30 DIAGNOSIS — B0229 Other postherpetic nervous system involvement: Secondary | ICD-10-CM | POA: Diagnosis not present

## 2017-03-30 DIAGNOSIS — Z89012 Acquired absence of left thumb: Secondary | ICD-10-CM

## 2017-03-30 DIAGNOSIS — N289 Disorder of kidney and ureter, unspecified: Secondary | ICD-10-CM

## 2017-03-30 DIAGNOSIS — R739 Hyperglycemia, unspecified: Secondary | ICD-10-CM

## 2017-03-30 DIAGNOSIS — Z5181 Encounter for therapeutic drug level monitoring: Secondary | ICD-10-CM | POA: Diagnosis not present

## 2017-03-30 DIAGNOSIS — I482 Chronic atrial fibrillation, unspecified: Secondary | ICD-10-CM

## 2017-03-30 HISTORY — DX: Acquired absence of left thumb: Z89.012

## 2017-03-30 NOTE — Assessment & Plan Note (Signed)
Well-controlled; avoid excess salt, follow the DASH guidelines

## 2017-03-30 NOTE — Assessment & Plan Note (Signed)
Appears to be healing well

## 2017-03-30 NOTE — Assessment & Plan Note (Signed)
Under management of Dr. Jill Side at [facility]; patient will make his own appointment

## 2017-03-30 NOTE — Assessment & Plan Note (Signed)
Check lipids; pleased to see LDL dropped 50+ points; limit saturated fats
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
## 2017-03-30 NOTE — Patient Instructions (Addendum)
If the infection is truly gone (ask your eye doctor tomorrow), then okay with me to get Shingrix at the pharmacy Try stopping all dairy products for one month and see if that helps your symptoms We'll get labs today If you have not heard anything from my staff in a week about any orders/referrals/studies from today, please contact us here to follow-up [phone] Try to lose 10-15 pounds over the coming few months Check out the information at familydoctor.org entitled "Nutrition for Weight Loss: What You Need to Know about Fad Diets" Try to lose between 1-2 pounds per week by taking in fewer calories and burning off more calories You can succeed by limiting portions, limiting foods dense in calories and fat, becoming more active, and drinking 8 glasses of water a day (64 ounces) Don't skip meals, especially breakfast, as skipping meals may alter your metabolism Do not use over-the-counter weight loss pills or gimmicks that claim rapid weight loss A healthy BMI (or body mass index) is between 18.5 and 24.9 You can calculate your ideal BMI at the [place] website [employer]fr

## 2017-03-30 NOTE — Assessment & Plan Note (Signed)
Check glucose and A1c 

## 2017-03-30 NOTE — Assessment & Plan Note (Signed)
Try to avoid NSAIDs

## 2017-03-30 NOTE — Assessment & Plan Note (Addendum)
Seeing eye doctor tomorrow; consider shingles vaccine, ask eye doctor; if infection is gone, then get the Shingrix at pharmacy

## 2017-03-30 NOTE — Assessment & Plan Note (Signed)
Check liver and kidneys 

## 2017-03-30 NOTE — Assessment & Plan Note (Signed)
Continue medicines, f/u with heart doctor; no sx of stroke or heart attack; rate is controlled

## 2017-03-31 DIAGNOSIS — H40003 Preglaucoma, unspecified, bilateral: Secondary | ICD-10-CM | POA: Diagnosis not present

## 2017-03-31 LAB — COMPLETE METABOLIC PANEL WITH GFR
AG RATIO: 1.9 (calc) (ref 1.0–2.5)
ALKALINE PHOSPHATASE (APISO): 39 U/L — AB (ref 40–115)
ALT: 13 U/L (ref 9–46)
AST: 16 U/L (ref 10–35)
Albumin: 4.3 g/dL (ref 3.6–5.1)
BUN: 21 mg/dL (ref 7–25)
CALCIUM: 9.6 mg/dL (ref 8.6–10.3)
CO2: 26 mmol/L (ref 20–32)
Chloride: 100 mmol/L (ref 98–110)
Creat: 1.18 mg/dL (ref 0.70–1.25)
GFR, EST NON AFRICAN AMERICAN: 64 mL/min/{1.73_m2} (ref >=60)
GFR, Est African American: 74 mL/min/{1.73_m2} (ref >=60)
GLOBULIN: 2.3 g/dL (ref 1.9–3.7)
Glucose, Bld: 97 mg/dL (ref 65–139)
POTASSIUM: 4.2 mmol/L (ref 3.5–5.3)
SODIUM: 135 mmol/L (ref 135–146)
Total Bilirubin: 0.9 mg/dL (ref 0.2–1.2)
Total Protein: 6.6 g/dL (ref 6.1–8.1)

## 2017-03-31 LAB — LIPID PANEL
Cholesterol: 237 mg/dL — ABNORMAL HIGH (ref <200)
HDL: 62 mg/dL (ref >40)
LDL Cholesterol (Calc): 152 mg/dL (calc) — ABNORMAL HIGH
NON-HDL CHOLESTEROL (CALC): 175 mg/dL — AB (ref <130)
TRIGLYCERIDES: 111 mg/dL (ref <150)
Total CHOL/HDL Ratio: 3.8 (calc) (ref <5.0)

## 2017-03-31 LAB — HEMOGLOBIN A1C
EAG (MMOL/L): 6.2 (calc)
Hgb A1c MFr Bld: 5.5 % of total Hgb (ref <5.7)
MEAN PLASMA GLUCOSE: 111 (calc)

## 2017-04-14 DIAGNOSIS — H40003 Preglaucoma, unspecified, bilateral: Secondary | ICD-10-CM | POA: Diagnosis not present

## 2017-04-21 ENCOUNTER — Encounter: Payer: Self-pay | Admitting: Family Medicine

## 2017-04-21 ENCOUNTER — Ambulatory Visit (INDEPENDENT_AMBULATORY_CARE_PROVIDER_SITE_OTHER): Payer: Medicare Other | Admitting: Family Medicine

## 2017-04-21 VITALS — BP 114/74 | HR 51 | Temp 98.4°F | Resp 14 | Ht 65.5 in | Wt 194.4 lb

## 2017-04-21 DIAGNOSIS — L989 Disorder of the skin and subcutaneous tissue, unspecified: Secondary | ICD-10-CM | POA: Diagnosis not present

## 2017-04-21 DIAGNOSIS — Z0000 Encounter for general adult medical examination without abnormal findings: Secondary | ICD-10-CM

## 2017-04-21 DIAGNOSIS — E782 Mixed hyperlipidemia: Secondary | ICD-10-CM | POA: Diagnosis not present

## 2017-04-21 NOTE — Assessment & Plan Note (Signed)
Copy of lipids sent to cardiologist; asked patient to contact him to see if starting Repatha or similar medicine indicated

## 2017-04-21 NOTE — Patient Instructions (Addendum)
Please ask your cardiologist, Dr. Nehemiah Massed, about starting a medicine to help lower your cholesterol called Repatha or [place] Maintenance  Topic Date Due  . INFLUENZA VACCINE  11/30/2017 (Originally 10/26/2016)  . TETANUS/TDAP  03/28/2018  . PNA vac Low Risk Adult (2 of 2 - PPSV23) 08/09/2020  . COLONOSCOPY  05/20/2021  . Hepatitis C Screening  Completed   Please do consider a flu shot every year I do recommend yearly flu shots; for individuals who don't want flu shots, try to practice excellent hand hygiene, and avoid nursing homes, day cares, and hospitals during peak flu season; taking additional vitamin C daily during flu/cold season may help boost your immune system too You do NOT need another pneumonia vaccine (ignore the above health maintenance for that) Your next tetanus booster will actually be due in the fall of 2028   Health Maintenance, Male A healthy lifestyle and preventive care is important for your health and wellness. Ask your health care provider about what schedule of regular examinations is right for you. What should I know about weight and diet? Eat a Healthy Diet  Eat plenty of vegetables, fruits, whole grains, low-fat dairy products, and lean protein.  Do not eat a lot of foods high in solid fats, added sugars, or salt.  Maintain a Healthy Weight Regular exercise can help you achieve or maintain a healthy weight. You should:  Do at least 150 minutes of exercise each week. The exercise should increase your heart rate and make you sweat (moderate-intensity exercise).  Do strength-training exercises at least twice a week.  Watch Your Levels of Cholesterol and Blood Lipids  Have your blood tested for lipids and cholesterol every 5 years starting at 67 years of age. If you are at high risk for heart disease, you should start having your blood tested when you are 67 years old. You may need to have your cholesterol levels checked more often if: ? Your  lipid or cholesterol levels are high. ? You are older than 67 years of age. ? You are at high risk for heart disease.  What should I know about cancer screening? Many types of cancers can be detected early and may often be prevented. Lung Cancer  You should be screened every year for lung cancer if: ? You are a current smoker who has smoked for at least 30 years. ? You are a former smoker who has quit within the past 15 years.  Talk to your health care provider about your screening options, when you should start screening, and how often you should be screened.  Colorectal Cancer  Routine colorectal cancer screening usually begins at 67 years of age and should be repeated every 5-10 years until you are 67 years old. You may need to be screened more often if early forms of precancerous polyps or small growths are found. Your health care provider may recommend screening at an earlier age if you have risk factors for colon cancer.  Your health care provider may recommend using home test kits to check for hidden blood in the stool.  A small camera at the end of a tube can be used to examine your colon (sigmoidoscopy or colonoscopy). This checks for the earliest forms of colorectal cancer.  Prostate and Testicular Cancer  Depending on your age and overall health, your health care provider may do certain tests to screen for prostate and testicular cancer.  Talk to your health care provider about any symptoms or concerns  you have about testicular or prostate cancer.  Skin Cancer  Check your skin from head to toe regularly.  Tell your health care provider about any new moles or changes in moles, especially if: ? There is a change in a mole's size, shape, or color. ? You have a mole that is larger than a pencil eraser.  Always use sunscreen. Apply sunscreen liberally and repeat throughout the day.  Protect yourself by wearing long sleeves, pants, a wide-brimmed hat, and sunglasses when  outside.  What should I know about heart disease, diabetes, and high blood pressure?  If you are 81-77 years of age, have your blood pressure checked every 3-5 years. If you are 76 years of age or older, have your blood pressure checked every year. You should have your blood pressure measured twice-once when you are at a hospital or clinic, and once when you are not at a hospital or clinic. Record the average of the two measurements. To check your blood pressure when you are not at a hospital or clinic, you can use: ? An automated blood pressure machine at a pharmacy. ? A home blood pressure monitor.  Talk to your health care provider about your target blood pressure.  If you are between 21-47 years old, ask your health care provider if you should take aspirin to prevent heart disease.  Have regular diabetes screenings by checking your fasting blood sugar level. ? If you are at a normal weight and have a low risk for diabetes, have this test once every three years after the age of 63. ? If you are overweight and have a high risk for diabetes, consider being tested at a younger age or more often.  A one-time screening for abdominal aortic aneurysm (AAA) by ultrasound is recommended for men aged 6-75 years who are current or former smokers. What should I know about preventing infection? Hepatitis B If you have a higher risk for hepatitis B, you should be screened for this virus. Talk with your health care provider to find out if you are at risk for hepatitis B infection. Hepatitis C Blood testing is recommended for:  Everyone born from [birth year] through 1965.  Anyone with known risk factors for hepatitis C.  Sexually Transmitted Diseases (STDs)  You should be screened each year for STDs including gonorrhea and chlamydia if: ? You are sexually active and are younger than 67 years of age. ? You are older than 67 years of age and your health care provider tells you that you are at risk for this  type of infection. ? Your sexual activity has changed since you were last screened and you are at an increased risk for chlamydia or gonorrhea. Ask your health care provider if you are at risk.  Talk with your health care provider about whether you are at high risk of being infected with HIV. Your health care provider may recommend a prescription medicine to help prevent HIV infection.  What else can I do?  Schedule regular health, dental, and eye exams.  Stay current with your vaccines (immunizations).  Do not use any tobacco products, such as cigarettes, chewing tobacco, and e-cigarettes. If you need help quitting, ask your health care provider.  Limit alcohol intake to no more than 2 drinks per day. One drink equals 12 ounces of beer, 5 ounces of wine, or 1 ounces of hard liquor.  Do not use street drugs.  Do not share needles.  Ask your health care provider for help  if you need support or information about quitting drugs.  Tell your health care provider if you often feel depressed.  Tell your health care provider if you have ever been abused or do not feel safe at home. This information is not intended to replace advice given to you by your health care provider. Make sure you discuss any questions you have with your health care provider. Document Released: 09/10/2007 Document Revised: 11/11/2015 Document Reviewed: 12/16/2014 Elsevier Interactive Patient Education  2018 [place] out the information at [employer].org entitled "Nutrition for Weight Loss: What You Need to Know about Fad Diets" Try to lose between 1-2 pounds per week by taking in fewer calories and burning off more calories You can succeed by limiting portions, limiting foods dense in calories and fat, becoming more active, and drinking 8 glasses of water a day (64 ounces) Don't skip meals, especially breakfast, as skipping meals may alter your metabolism Do not use over-the-counter weight loss pills or  gimmicks that claim rapid weight loss A healthy BMI (or body mass index) is between 18.5 and 24.9 You can calculate your ideal BMI at the [place] website [employer]Monet[employer]fr

## 2017-04-21 NOTE — Assessment & Plan Note (Signed)
USPSTF grade A and B recommendations reviewed with patient; age-appropriate recommendations, preventive care, screening tests, etc discussed and encouraged; healthy living encouraged; see AVS for patient education given to patient  
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
## 2017-05-03 DIAGNOSIS — L578 Other skin changes due to chronic exposure to nonionizing radiation: Secondary | ICD-10-CM | POA: Diagnosis not present

## 2017-05-03 DIAGNOSIS — D692 Other nonthrombocytopenic purpura: Secondary | ICD-10-CM | POA: Diagnosis not present

## 2017-05-03 DIAGNOSIS — L821 Other seborrheic keratosis: Secondary | ICD-10-CM | POA: Diagnosis not present

## 2017-05-03 DIAGNOSIS — Z808 Family history of malignant neoplasm of other organs or systems: Secondary | ICD-10-CM | POA: Diagnosis not present

## 2017-05-03 DIAGNOSIS — L570 Actinic keratosis: Secondary | ICD-10-CM | POA: Diagnosis not present

## 2017-05-15 DIAGNOSIS — B0232 Zoster iridocyclitis: Secondary | ICD-10-CM | POA: Diagnosis not present

## 2017-05-16 NOTE — Progress Notes (Signed)
Closing out lab/order note open since:  Aug 2018

## 2017-05-16 NOTE — Progress Notes (Signed)
Closing out lab/order note open since:  July 2018 

## 2017-05-27 DIAGNOSIS — B9689 Other specified bacterial agents as the cause of diseases classified elsewhere: Secondary | ICD-10-CM | POA: Diagnosis not present

## 2017-05-27 DIAGNOSIS — J019 Acute sinusitis, unspecified: Secondary | ICD-10-CM | POA: Diagnosis not present

## 2017-05-27 DIAGNOSIS — J40 Bronchitis, not specified as acute or chronic: Secondary | ICD-10-CM | POA: Diagnosis not present

## 2017-06-02 DIAGNOSIS — M9902 Segmental and somatic dysfunction of thoracic region: Secondary | ICD-10-CM | POA: Diagnosis not present

## 2017-06-02 DIAGNOSIS — M9901 Segmental and somatic dysfunction of cervical region: Secondary | ICD-10-CM | POA: Diagnosis not present

## 2017-06-02 DIAGNOSIS — M531 Cervicobrachial syndrome: Secondary | ICD-10-CM | POA: Diagnosis not present

## 2017-06-02 DIAGNOSIS — M436 Torticollis: Secondary | ICD-10-CM | POA: Diagnosis not present

## 2017-06-05 DIAGNOSIS — M531 Cervicobrachial syndrome: Secondary | ICD-10-CM | POA: Diagnosis not present

## 2017-06-05 DIAGNOSIS — M9902 Segmental and somatic dysfunction of thoracic region: Secondary | ICD-10-CM | POA: Diagnosis not present

## 2017-06-05 DIAGNOSIS — M436 Torticollis: Secondary | ICD-10-CM | POA: Diagnosis not present

## 2017-06-05 DIAGNOSIS — M9901 Segmental and somatic dysfunction of cervical region: Secondary | ICD-10-CM | POA: Diagnosis not present

## 2017-06-06 DIAGNOSIS — B0232 Zoster iridocyclitis: Secondary | ICD-10-CM | POA: Diagnosis not present

## 2017-06-07 DIAGNOSIS — M9902 Segmental and somatic dysfunction of thoracic region: Secondary | ICD-10-CM | POA: Diagnosis not present

## 2017-06-07 DIAGNOSIS — M436 Torticollis: Secondary | ICD-10-CM | POA: Diagnosis not present

## 2017-06-07 DIAGNOSIS — M531 Cervicobrachial syndrome: Secondary | ICD-10-CM | POA: Diagnosis not present

## 2017-06-07 DIAGNOSIS — M9901 Segmental and somatic dysfunction of cervical region: Secondary | ICD-10-CM | POA: Diagnosis not present

## 2017-06-09 DIAGNOSIS — M436 Torticollis: Secondary | ICD-10-CM | POA: Diagnosis not present

## 2017-06-09 DIAGNOSIS — M9902 Segmental and somatic dysfunction of thoracic region: Secondary | ICD-10-CM | POA: Diagnosis not present

## 2017-06-09 DIAGNOSIS — M9901 Segmental and somatic dysfunction of cervical region: Secondary | ICD-10-CM | POA: Diagnosis not present

## 2017-06-09 DIAGNOSIS — M531 Cervicobrachial syndrome: Secondary | ICD-10-CM | POA: Diagnosis not present

## 2017-06-12 DIAGNOSIS — M531 Cervicobrachial syndrome: Secondary | ICD-10-CM | POA: Diagnosis not present

## 2017-06-12 DIAGNOSIS — M9902 Segmental and somatic dysfunction of thoracic region: Secondary | ICD-10-CM | POA: Diagnosis not present

## 2017-06-12 DIAGNOSIS — M436 Torticollis: Secondary | ICD-10-CM | POA: Diagnosis not present

## 2017-06-12 DIAGNOSIS — M9901 Segmental and somatic dysfunction of cervical region: Secondary | ICD-10-CM | POA: Diagnosis not present

## 2017-06-15 ENCOUNTER — Ambulatory Visit
Admission: RE | Admit: 2017-06-15 | Discharge: 2017-06-15 | Disposition: A | Discharge status: Home / Self Care | Payer: Medicare Other | Source: Ambulatory Visit | Attending: Family Medicine | Admitting: Family Medicine

## 2017-06-15 ENCOUNTER — Ambulatory Visit (INDEPENDENT_AMBULATORY_CARE_PROVIDER_SITE_OTHER): Payer: Medicare Other | Admitting: Family Medicine

## 2017-06-15 ENCOUNTER — Encounter: Payer: Self-pay | Admitting: Family Medicine

## 2017-06-15 VITALS — BP 124/72 | HR 100 | Temp 98.3°F | Resp 16 | Ht 66.0 in | Wt 193.7 lb

## 2017-06-15 DIAGNOSIS — R05 Cough: Secondary | ICD-10-CM | POA: Insufficient documentation

## 2017-06-15 DIAGNOSIS — R059 Cough, unspecified: Secondary | ICD-10-CM

## 2017-06-15 MED ORDER — DOXYCYCLINE HYCLATE 100 MG PO TABS: 100.0000 mg | ORAL_TABLET | Freq: Two times a day (BID) | ORAL | 0 refills | Status: DC

## 2017-06-15 NOTE — Patient Instructions (Addendum)
Please start the new antibiotics Try vitamin C (orange juice if not diabetic or vitamin C tablets) and drink green tea to help your immune system during your illness Get plenty of rest and hydration Please do eat yogurt or kimchi or take a probiotic daily for the next month We want to replace the healthy germs in the gut If you notice foul, watery diarrhea in the next two months, schedule an appointment RIGHT AWAY or go to an urgent care or the emergency room if a holiday or over a weekend Let's get a chest xray and urine test today If you have not heard anything from my staff in a week about any orders/referrals/studies from today, please contact us here to follow-up [phone]
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
## 2017-06-16 LAB — LEGIONELLA ANTIGEN, URINE
Legionella Antigen, Urine: NOT DETECTED
MICRO NUMBER:: 90358737
SPECIMEN QUALITY:: ADEQUATE

## 2017-06-29 DIAGNOSIS — L570 Actinic keratosis: Secondary | ICD-10-CM | POA: Diagnosis not present

## 2017-06-29 DIAGNOSIS — L578 Other skin changes due to chronic exposure to nonionizing radiation: Secondary | ICD-10-CM | POA: Diagnosis not present

## 2017-06-29 DIAGNOSIS — D692 Other nonthrombocytopenic purpura: Secondary | ICD-10-CM | POA: Diagnosis not present

## 2017-07-04 DIAGNOSIS — E782 Mixed hyperlipidemia: Secondary | ICD-10-CM | POA: Diagnosis not present

## 2017-07-04 DIAGNOSIS — I6523 Occlusion and stenosis of bilateral carotid arteries: Secondary | ICD-10-CM | POA: Diagnosis not present

## 2017-07-04 DIAGNOSIS — I10 Essential (primary) hypertension: Secondary | ICD-10-CM | POA: Diagnosis not present

## 2017-07-04 DIAGNOSIS — I071 Rheumatic tricuspid insufficiency: Secondary | ICD-10-CM | POA: Diagnosis not present

## 2017-07-04 DIAGNOSIS — I482 Chronic atrial fibrillation: Secondary | ICD-10-CM | POA: Diagnosis not present

## 2017-07-05 DIAGNOSIS — H2511 Age-related nuclear cataract, right eye: Secondary | ICD-10-CM | POA: Diagnosis not present

## 2017-07-05 DIAGNOSIS — E782 Mixed hyperlipidemia: Secondary | ICD-10-CM | POA: Diagnosis not present

## 2017-07-19 DIAGNOSIS — L570 Actinic keratosis: Secondary | ICD-10-CM | POA: Diagnosis not present

## 2017-07-21 DIAGNOSIS — I482 Chronic atrial fibrillation: Secondary | ICD-10-CM | POA: Diagnosis not present

## 2017-07-25 DIAGNOSIS — C61 Malignant neoplasm of prostate: Secondary | ICD-10-CM | POA: Diagnosis not present

## 2017-08-01 DIAGNOSIS — I6523 Occlusion and stenosis of bilateral carotid arteries: Secondary | ICD-10-CM | POA: Diagnosis not present

## 2017-08-01 DIAGNOSIS — E782 Mixed hyperlipidemia: Secondary | ICD-10-CM | POA: Diagnosis not present

## 2017-08-01 DIAGNOSIS — I482 Chronic atrial fibrillation: Secondary | ICD-10-CM | POA: Diagnosis not present

## 2017-08-01 DIAGNOSIS — I10 Essential (primary) hypertension: Secondary | ICD-10-CM | POA: Diagnosis not present

## 2017-08-02 DIAGNOSIS — R9721 Rising PSA following treatment for malignant neoplasm of prostate: Secondary | ICD-10-CM | POA: Diagnosis not present

## 2017-08-02 DIAGNOSIS — C61 Malignant neoplasm of prostate: Secondary | ICD-10-CM | POA: Diagnosis not present

## 2017-08-10 DIAGNOSIS — I482 Chronic atrial fibrillation: Secondary | ICD-10-CM | POA: Diagnosis not present

## 2017-08-10 DIAGNOSIS — I10 Essential (primary) hypertension: Secondary | ICD-10-CM | POA: Diagnosis not present

## 2017-08-10 DIAGNOSIS — R21 Rash and other nonspecific skin eruption: Secondary | ICD-10-CM | POA: Insufficient documentation

## 2017-08-10 DIAGNOSIS — I071 Rheumatic tricuspid insufficiency: Secondary | ICD-10-CM | POA: Diagnosis not present

## 2017-08-10 DIAGNOSIS — I6523 Occlusion and stenosis of bilateral carotid arteries: Secondary | ICD-10-CM | POA: Diagnosis not present

## 2017-08-16 DIAGNOSIS — H2513 Age-related nuclear cataract, bilateral: Secondary | ICD-10-CM | POA: Diagnosis not present

## 2017-08-17 DIAGNOSIS — L259 Unspecified contact dermatitis, unspecified cause: Secondary | ICD-10-CM | POA: Diagnosis not present

## 2017-08-17 DIAGNOSIS — L309 Dermatitis, unspecified: Secondary | ICD-10-CM | POA: Diagnosis not present

## 2017-08-17 DIAGNOSIS — R21 Rash and other nonspecific skin eruption: Secondary | ICD-10-CM | POA: Diagnosis not present

## 2017-08-23 IMAGING — DX DG CHEST 1V
1 series · 1 of 1 positions shown · non-contrast
Comparison: 07/02/2015

CLINICAL DATA: Cough, CHF

EXAM:
CHEST 1 VIEW

[chest ap]
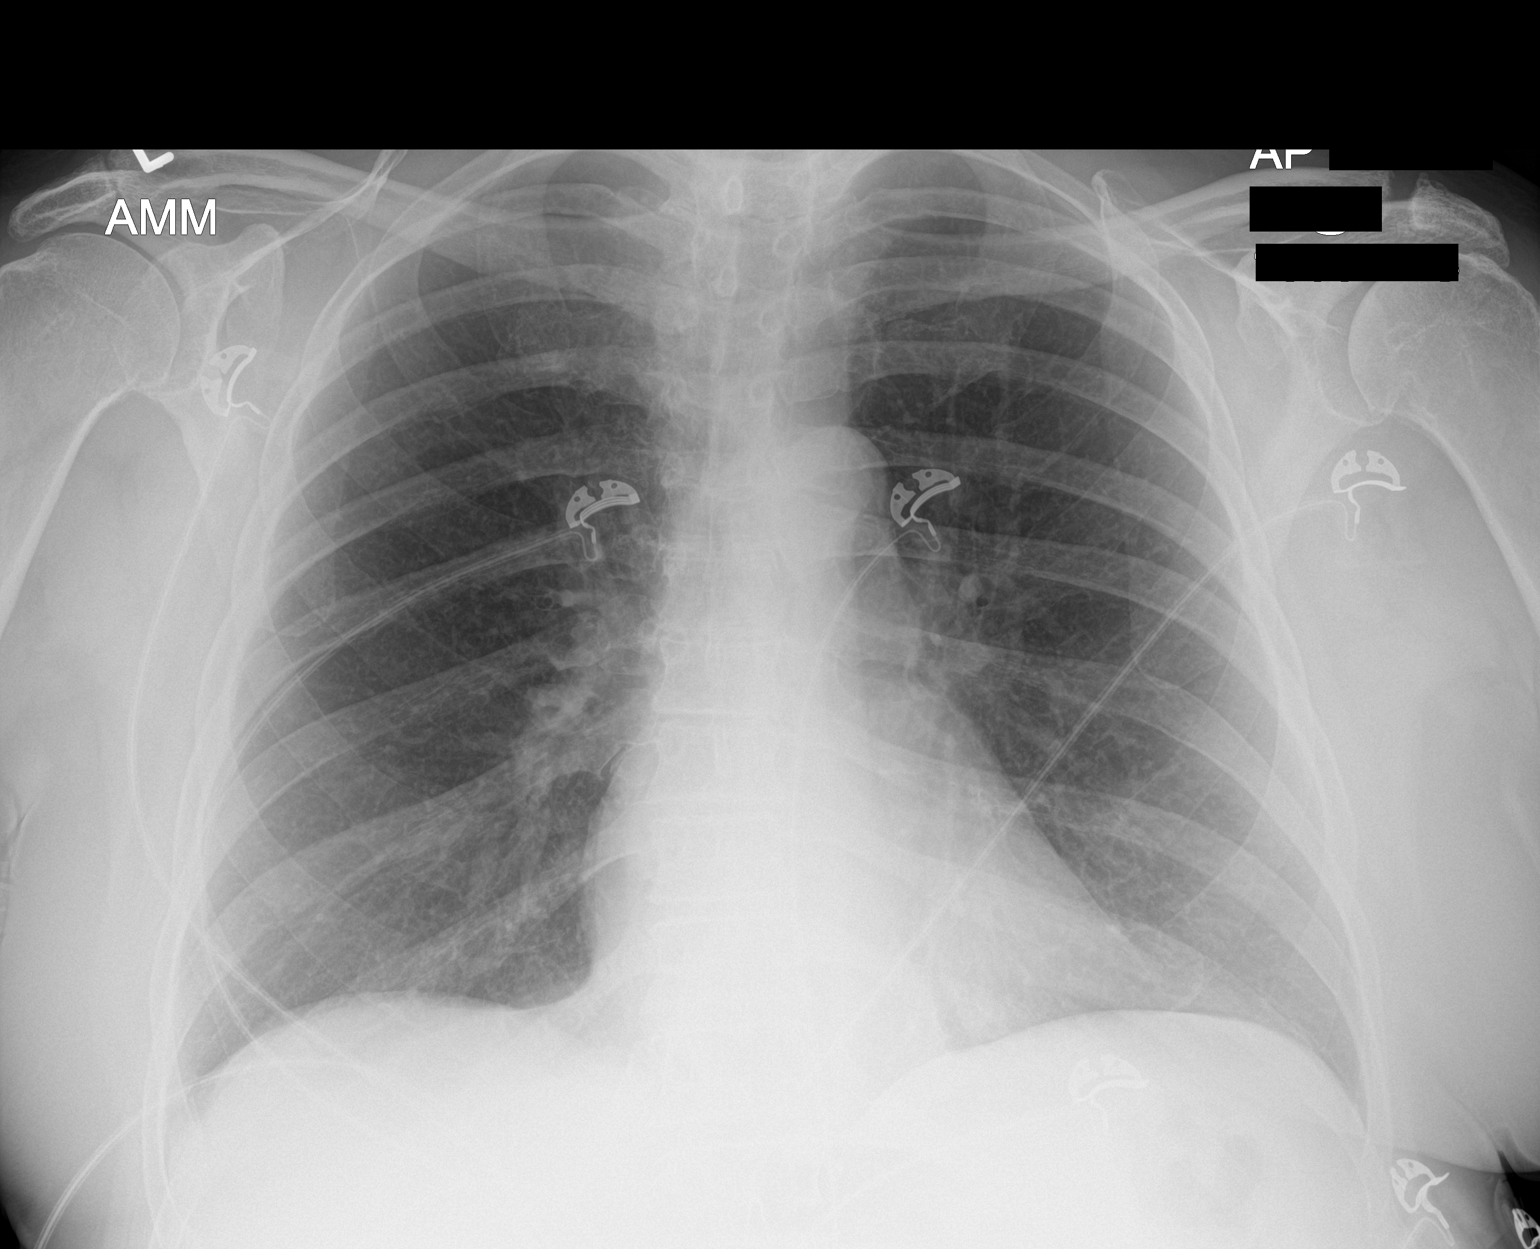

[1 of 1 positions shown; findings below may reference images not displayed]

FINDINGS: The heart size and mediastinal contours are within normal limits.
Both lungs are clear. The visualized skeletal structures are
unremarkable.
IMPRESSION: No active disease.

## 2017-08-24 DIAGNOSIS — R21 Rash and other nonspecific skin eruption: Secondary | ICD-10-CM | POA: Diagnosis not present

## 2017-08-24 DIAGNOSIS — L820 Inflamed seborrheic keratosis: Secondary | ICD-10-CM | POA: Diagnosis not present

## 2017-08-24 DIAGNOSIS — L821 Other seborrheic keratosis: Secondary | ICD-10-CM | POA: Diagnosis not present

## 2017-08-24 DIAGNOSIS — L570 Actinic keratosis: Secondary | ICD-10-CM | POA: Diagnosis not present

## 2017-09-05 ENCOUNTER — Other Ambulatory Visit: Payer: Self-pay | Admitting: Family Medicine

## 2017-09-06 DIAGNOSIS — H2511 Age-related nuclear cataract, right eye: Secondary | ICD-10-CM | POA: Diagnosis not present

## 2017-09-14 DIAGNOSIS — I6523 Occlusion and stenosis of bilateral carotid arteries: Secondary | ICD-10-CM | POA: Diagnosis not present

## 2017-09-14 DIAGNOSIS — I482 Chronic atrial fibrillation: Secondary | ICD-10-CM | POA: Diagnosis not present

## 2017-09-14 DIAGNOSIS — R21 Rash and other nonspecific skin eruption: Secondary | ICD-10-CM | POA: Diagnosis not present

## 2017-09-14 DIAGNOSIS — I10 Essential (primary) hypertension: Secondary | ICD-10-CM | POA: Diagnosis not present

## 2017-09-14 DIAGNOSIS — I071 Rheumatic tricuspid insufficiency: Secondary | ICD-10-CM | POA: Diagnosis not present

## 2017-09-18 ENCOUNTER — Other Ambulatory Visit: Payer: Self-pay

## 2017-09-18 ENCOUNTER — Encounter: Payer: Self-pay | Admitting: *Deleted

## 2017-09-22 NOTE — Discharge Instructions (Signed)

## 2017-09-25 ENCOUNTER — Ambulatory Visit
Admission: RE | Admit: 2017-09-25 | Discharge: 2017-09-25 | Disposition: A | Discharge status: Home / Self Care | Payer: Medicare Other | Source: Ambulatory Visit | Attending: Ophthalmology | Admitting: Ophthalmology

## 2017-09-25 ENCOUNTER — Ambulatory Visit: Payer: Medicare Other | Admitting: Anesthesiology

## 2017-09-25 ENCOUNTER — Encounter
Admission: RE | Disposition: A | Discharge status: Home / Self Care | Payer: Self-pay | Source: Ambulatory Visit | Attending: Ophthalmology

## 2017-09-25 DIAGNOSIS — H9190 Unspecified hearing loss, unspecified ear: Secondary | ICD-10-CM | POA: Diagnosis not present

## 2017-09-25 DIAGNOSIS — R002 Palpitations: Secondary | ICD-10-CM | POA: Diagnosis not present

## 2017-09-25 DIAGNOSIS — K5790 Diverticulosis of intestine, part unspecified, without perforation or abscess without bleeding: Secondary | ICD-10-CM | POA: Diagnosis not present

## 2017-09-25 DIAGNOSIS — E7800 Pure hypercholesterolemia, unspecified: Secondary | ICD-10-CM | POA: Insufficient documentation

## 2017-09-25 DIAGNOSIS — R0601 Orthopnea: Secondary | ICD-10-CM | POA: Insufficient documentation

## 2017-09-25 DIAGNOSIS — M20002 Unspecified deformity of left finger(s): Secondary | ICD-10-CM | POA: Insufficient documentation

## 2017-09-25 DIAGNOSIS — I10 Essential (primary) hypertension: Secondary | ICD-10-CM | POA: Diagnosis not present

## 2017-09-25 DIAGNOSIS — H2511 Age-related nuclear cataract, right eye: Secondary | ICD-10-CM | POA: Insufficient documentation

## 2017-09-25 DIAGNOSIS — H401111 Primary open-angle glaucoma, right eye, mild stage: Secondary | ICD-10-CM | POA: Diagnosis not present

## 2017-09-25 DIAGNOSIS — Z87442 Personal history of urinary calculi: Secondary | ICD-10-CM | POA: Diagnosis not present

## 2017-09-25 DIAGNOSIS — H401131 Primary open-angle glaucoma, bilateral, mild stage: Secondary | ICD-10-CM | POA: Diagnosis not present

## 2017-09-25 DIAGNOSIS — I4891 Unspecified atrial fibrillation: Secondary | ICD-10-CM | POA: Diagnosis not present

## 2017-09-25 DIAGNOSIS — Z91041 Radiographic dye allergy status: Secondary | ICD-10-CM | POA: Insufficient documentation

## 2017-09-25 DIAGNOSIS — H25811 Combined forms of age-related cataract, right eye: Secondary | ICD-10-CM | POA: Diagnosis not present

## 2017-09-25 DIAGNOSIS — Z85828 Personal history of other malignant neoplasm of skin: Secondary | ICD-10-CM | POA: Insufficient documentation

## 2017-09-25 DIAGNOSIS — Z888 Allergy status to other drugs, medicaments and biological substances status: Secondary | ICD-10-CM | POA: Insufficient documentation

## 2017-09-25 HISTORY — DX: Other localized visual field defect, right eye: H53.451

## 2017-09-25 HISTORY — PX: CATARACT EXTRACTION W/PHACO: SHX586

## 2017-09-25 SURGERY — PHACOEMULSIFICATION, CATARACT, WITH IOL INSERTION
Anesthesia: Monitor Anesthesia Care | Site: Eye | Laterality: Right | Wound class: Clean

## 2017-09-25 MED ORDER — LACTATED RINGERS IV SOLN: INTRAVENOUS | Status: DC

## 2017-09-25 MED ORDER — MIDAZOLAM HCL 2 MG/2ML IJ SOLN
INTRAMUSCULAR | Status: DC | PRN
  Administered 2017-09-25: 1 mg via INTRAVENOUS

## 2017-09-25 MED ORDER — SODIUM HYALURONATE 10 MG/ML IO SOLN
INTRAOCULAR | Status: DC | PRN
  Administered 2017-09-25: 0.55 mL via INTRAOCULAR

## 2017-09-25 MED ORDER — OXYCODONE HCL 5 MG/5ML PO SOLN: 5.0000 mg | Freq: Once | ORAL | Status: DC | PRN

## 2017-09-25 MED ORDER — OXYCODONE HCL 5 MG PO TABS: 5.0000 mg | ORAL_TABLET | Freq: Once | ORAL | Status: DC | PRN

## 2017-09-25 MED ORDER — LIDOCAINE HCL (PF) 2 % IJ SOLN
INTRAOCULAR | Status: DC | PRN
  Administered 2017-09-25: 1 mL via INTRAOCULAR

## 2017-09-25 MED ORDER — FENTANYL CITRATE (PF) 100 MCG/2ML IJ SOLN
INTRAMUSCULAR | Status: DC | PRN
  Administered 2017-09-25: 50 ug via INTRAVENOUS

## 2017-09-25 MED ORDER — SODIUM HYALURONATE 23 MG/ML IO SOLN
INTRAOCULAR | Status: DC | PRN
  Administered 2017-09-25: 0.6 mL via INTRAOCULAR

## 2017-09-25 MED ORDER — ARMC OPHTHALMIC DILATING DROPS
1.0000 "application " | OPHTHALMIC | Status: DC | PRN
  Administered 2017-09-25 (×3): 1 via OPHTHALMIC

## 2017-09-25 MED ORDER — MOXIFLOXACIN HCL 0.5 % OP SOLN
OPHTHALMIC | Status: DC | PRN
  Administered 2017-09-25: 0.2 mL via OPHTHALMIC

## 2017-09-25 MED ORDER — BSS IO SOLN
INTRAOCULAR | Status: DC | PRN
  Administered 2017-09-25: 68 mL via OPHTHALMIC

## 2017-09-25 SURGICAL SUPPLY — 19 items
CANNULA ANT/CHMB 27GA (MISCELLANEOUS) ×2 IMPLANT
DEVICE INJECT ISTENT W (Stent) ×1 IMPLANT
DISSECTOR HYDRO NUCLEUS 50X22 (MISCELLANEOUS) ×2 IMPLANT
GLOVE BIO SURGEON STRL SZ8 (GLOVE) ×2 IMPLANT
GLOVE SURG LX 7.5 STRW (GLOVE) ×1
GLOVE SURG LX STRL 7.5 STRW (GLOVE) ×1 IMPLANT
GOWN STRL REUS W/ TWL LRG LVL3 (GOWN DISPOSABLE) ×2 IMPLANT
GOWN STRL REUS W/TWL LRG LVL3 (GOWN DISPOSABLE) ×2
ICLIP (OPHTHALMIC RELATED) ×2 IMPLANT
INJECT ISTENT W (Stent) ×2 IMPLANT
LENS IOL TECNIS ITEC 20.5 (Intraocular Lens) ×2 IMPLANT
MARKER SKIN DUAL TIP RULER LAB (MISCELLANEOUS) ×2 IMPLANT
PACK CATARACT (MISCELLANEOUS) ×2 IMPLANT
PACK DR. KING ARMS (PACKS) ×2 IMPLANT
PACK EYE AFTER SURG (MISCELLANEOUS) ×2 IMPLANT
SYR 3ML LL SCALE MARK (SYRINGE) ×2 IMPLANT
SYR TB 1ML LUER SLIP (SYRINGE) ×2 IMPLANT
WATER STERILE IRR 500ML POUR (IV SOLUTION) ×2 IMPLANT
WIPE NON LINTING 3.25X3.25 (MISCELLANEOUS) ×2 IMPLANT

## 2017-09-25 NOTE — Anesthesia Preprocedure Evaluation (Signed)
Anesthesia Evaluation  Patient identified by MRN, date of birth, ID band Patient awake    Reviewed: Allergy & Precautions, H&P , NPO status , Patient's Chart, lab work & pertinent test results  Airway Mallampati: II  TM Distance: >3 FB Neck ROM: full    Dental no notable dental hx.    Pulmonary    Pulmonary exam normal        Cardiovascular hypertension, On Medications Normal cardiovascular exam     Neuro/Psych    GI/Hepatic Neg liver ROS, Medicated,  Endo/Other  negative endocrine ROS  Renal/GU   negative genitourinary   Musculoskeletal   Abdominal   Peds  Hematology negative hematology ROS (+)   Anesthesia Other Findings   Reproductive/Obstetrics                             Anesthesia Physical Anesthesia Plan  ASA: II  Anesthesia Plan: MAC   Post-op Pain Management:    Induction:   PONV Risk Score and Plan:   Airway Management Planned:   Additional Equipment:   Intra-op Plan:   Post-operative Plan:   Informed Consent: I have reviewed the patients History and Physical, chart, labs and discussed the procedure including the risks, benefits and alternatives for the proposed anesthesia with the patient or authorized representative who has indicated his/her understanding and acceptance.     Plan Discussed with:   Anesthesia Plan Comments:         Anesthesia Quick Evaluation

## 2017-09-25 NOTE — Anesthesia Procedure Notes (Signed)
Procedure Name: MAC Date/Time: 09/25/2017 11:30 AM Performed by: Lind Guest, CRNA Pre-anesthesia Checklist: Patient identified, Emergency Drugs available, Suction available, Patient being monitored and Timeout performed Patient Re-evaluated:Patient Re-evaluated prior to induction Oxygen Delivery Method: Nasal cannula

## 2017-09-25 NOTE — Transfer of Care (Signed)
Immediate Anesthesia Transfer of Care Note  Patient: Michael Cantrell  Procedure(s) Performed: CATARACT EXTRACTION PHACO AND INTRAOCULAR LENS PLACEMENT (IOC) RIGHT ISTENT INJECT  IVA TOPICAL (Right Eye)  Patient Location: PACU  Anesthesia Type: MAC  Level of Consciousness: awake, alert  and patient cooperative  Airway and Oxygen Therapy: Patient Spontanous Breathing and Patient connected to supplemental oxygen  Post-op Assessment: Post-op Vital signs reviewed, Patient's Cardiovascular Status Stable, Respiratory Function Stable, Patent Airway and No signs of Nausea or vomiting  Post-op Vital Signs: Reviewed and stable  Complications: No apparent anesthesia complications

## 2017-09-25 NOTE — Anesthesia Postprocedure Evaluation (Signed)
Anesthesia Post Note  Patient: Michael Cantrell  Procedure(s) Performed: CATARACT EXTRACTION PHACO AND INTRAOCULAR LENS PLACEMENT (IOC) RIGHT ISTENT INJECT  IVA TOPICAL (Right Eye)  Patient location during evaluation: PACU Anesthesia Type: MAC Level of consciousness: awake and alert Pain management: pain level controlled Vital Signs Assessment: post-procedure vital signs reviewed and stable Respiratory status: spontaneous breathing Cardiovascular status: blood pressure returned to baseline Postop Assessment: no headache Anesthetic complications: no    Jaci Standard, III,  Shemar Plemmons D

## 2017-09-25 NOTE — Op Note (Signed)
OPERATIVE NOTE  Michael Cantrell [id number] 09/25/2017  PREOPERATIVE DIAGNOSIS:   1.  Mild open angle glaucoma, right eye. H40.1111 2.  Nuclear sclerotic cataract right eye.  H25.11   POSTOPERATIVE DIAGNOSIS:    same.   PROCEDURE:   1.  Placement of trabecular bypass stent (istent). CPT 0191T  and placement of additional stent  CPT 0376T 2.  Phacoemusification with posterior chamber intraocular lens placement of the right eye  CPT [phone]   LENS: Implant Name Type Inv. Item Serial No. Manufacturer Lot No. LRB No. Used  [name] - [id number] Stent INJECT ISTENT [id number] [employer] [id number] Right 1  LENS IOL DIOP 20.5 - [id number] Intraocular Lens LENS IOL DIOP 20.5 [id number] AMO  Right 1         ULTRASOUND TIME: 0 minutes 27 seconds.  CDE 4.16   SURGEON:  Benay Pillow, MD, MPH  ANESTHESIOLOGIST: Anesthesiologist: Gunnar Fusi, MD CRNA: Nelda Marseille, CRNA; Allean Found, CRNA   ANESTHESIA:  MAC and intracameral preservative-free intracameral lidocaine 4%.  ESTIMATED BLOOD LOSS: less than 1 mL.   COMPLICATIONS:  None.   DESCRIPTION OF PROCEDURE:  The patient was identified in the holding room and transported to the operating room.   The patient was placed in the supine position under the operating microscope. The right eye was prepped and draped in the usual sterile ophthalmic fashion.   A 1.0 millimeter clear-corneal paracentesis was made at the 10:30 position. 0.5 ml of preservative-free 1% lidocaine with epinephrine was injected into the anterior chamber.  The anterior chamber was filled with Healon 5 viscoelastic.  A 2.4 millimeter keratome was used to make a near-clear corneal incision at the 8:00 position.   Attention was turned to the istent.  The patients head was turned to the left and the microscope was tilted to 035 degrees.  Ocular instruments/[employer] OAL/H2 gonioprism was used with IPC05 (iclip) coupled with Healon 5 on the cornea was used to  visualize the trabecular meshwork. The istent was opened and introduced into the eye.  The meshwork was engaged with the tip of the iStent injector and the stent was deployed with the injector into Schlemm's canal at 2:00.  A second stent was inserted at 4:00.  The stents were well seated.   Next, attention was turned to the phacoemulsification A curvilinear capsulorrhexis was made with a cystotome and capsulorrhexis forceps.  Balanced salt solution was used to hydrodissect and hydrodelineate the nucleus.   Phacoemulsification was then used in stop and chop fashion to remove the lens nucleus and epinucleus.  The remaining cortex was then removed using the irrigation and aspiration handpiece. Healon was then placed into the capsular bag to distend it for lens placement.  A lens was then injected into the capsular bag.  The remaining viscoelastic was aspirated.   Wounds were hydrated with balanced salt solution.  The anterior chamber was inflated to a physiologic pressure with balanced salt solution.    Intracameral vigamox 0.1 mL undiluted was injected into the eye and a drop placed onto the ocular surface.  No wound leaks were noted. The patient was taken to the recovery room in stable condition without complications of anesthesia or surgery   Benay Pillow 09/25/2017, 12:00 PM

## 2017-09-25 NOTE — H&P (Signed)
The History and Physical notes are on paper, have been signed, and are to be scanned.   I have examined the patient and there are no changes to the H&P.   Benay Pillow 09/25/2017 11:17 AM

## 2017-09-26 ENCOUNTER — Encounter: Payer: Self-pay | Admitting: Ophthalmology

## 2017-10-11 DIAGNOSIS — D692 Other nonthrombocytopenic purpura: Secondary | ICD-10-CM | POA: Diagnosis not present

## 2017-10-11 DIAGNOSIS — L821 Other seborrheic keratosis: Secondary | ICD-10-CM | POA: Diagnosis not present

## 2017-10-11 DIAGNOSIS — L570 Actinic keratosis: Secondary | ICD-10-CM | POA: Diagnosis not present

## 2017-10-11 DIAGNOSIS — L309 Dermatitis, unspecified: Secondary | ICD-10-CM | POA: Diagnosis not present

## 2017-10-11 DIAGNOSIS — L578 Other skin changes due to chronic exposure to nonionizing radiation: Secondary | ICD-10-CM | POA: Diagnosis not present

## 2017-10-16 DIAGNOSIS — I6523 Occlusion and stenosis of bilateral carotid arteries: Secondary | ICD-10-CM | POA: Diagnosis not present

## 2017-10-16 DIAGNOSIS — I482 Chronic atrial fibrillation: Secondary | ICD-10-CM | POA: Diagnosis not present

## 2017-10-16 DIAGNOSIS — I10 Essential (primary) hypertension: Secondary | ICD-10-CM | POA: Diagnosis not present

## 2017-10-16 DIAGNOSIS — I480 Paroxysmal atrial fibrillation: Secondary | ICD-10-CM | POA: Diagnosis not present

## 2017-10-16 DIAGNOSIS — I071 Rheumatic tricuspid insufficiency: Secondary | ICD-10-CM | POA: Diagnosis not present

## 2017-10-16 DIAGNOSIS — I872 Venous insufficiency (chronic) (peripheral): Secondary | ICD-10-CM | POA: Diagnosis not present

## 2017-10-16 DIAGNOSIS — R21 Rash and other nonspecific skin eruption: Secondary | ICD-10-CM | POA: Diagnosis not present

## 2017-11-28 ENCOUNTER — Other Ambulatory Visit: Payer: Self-pay | Admitting: Urology

## 2017-11-28 DIAGNOSIS — C61 Malignant neoplasm of prostate: Secondary | ICD-10-CM

## 2017-11-29 DIAGNOSIS — C61 Malignant neoplasm of prostate: Secondary | ICD-10-CM | POA: Diagnosis not present

## 2017-12-01 DIAGNOSIS — C61 Malignant neoplasm of prostate: Secondary | ICD-10-CM | POA: Diagnosis not present

## 2017-12-01 DIAGNOSIS — Z8546 Personal history of malignant neoplasm of prostate: Secondary | ICD-10-CM | POA: Diagnosis not present

## 2017-12-04 ENCOUNTER — Encounter (HOSPITAL_COMMUNITY)
Admission: RE | Admit: 2017-12-04 | Discharge: 2017-12-04 | Disposition: A | Discharge status: Home / Self Care | Payer: Medicare Other | Source: Ambulatory Visit | Attending: Urology | Admitting: Urology

## 2017-12-04 DIAGNOSIS — C61 Malignant neoplasm of prostate: Secondary | ICD-10-CM | POA: Insufficient documentation

## 2017-12-04 MED ORDER — TECHNETIUM TC 99M MEDRONATE IV KIT
20.4000 | PACK | Freq: Once | INTRAVENOUS | Status: AC | PRN
  Administered 2017-12-04: 20.4 via INTRAVENOUS

## 2017-12-06 DIAGNOSIS — C61 Malignant neoplasm of prostate: Secondary | ICD-10-CM | POA: Diagnosis not present

## 2018-01-11 DIAGNOSIS — L578 Other skin changes due to chronic exposure to nonionizing radiation: Secondary | ICD-10-CM | POA: Diagnosis not present

## 2018-01-11 DIAGNOSIS — D485 Neoplasm of uncertain behavior of skin: Secondary | ICD-10-CM | POA: Diagnosis not present

## 2018-01-11 DIAGNOSIS — L570 Actinic keratosis: Secondary | ICD-10-CM | POA: Diagnosis not present

## 2018-01-11 DIAGNOSIS — L821 Other seborrheic keratosis: Secondary | ICD-10-CM | POA: Diagnosis not present

## 2018-01-11 DIAGNOSIS — D692 Other nonthrombocytopenic purpura: Secondary | ICD-10-CM | POA: Diagnosis not present

## 2018-01-31 DIAGNOSIS — I482 Chronic atrial fibrillation, unspecified: Secondary | ICD-10-CM | POA: Diagnosis not present

## 2018-01-31 DIAGNOSIS — I6523 Occlusion and stenosis of bilateral carotid arteries: Secondary | ICD-10-CM | POA: Diagnosis not present

## 2018-01-31 DIAGNOSIS — E782 Mixed hyperlipidemia: Secondary | ICD-10-CM | POA: Diagnosis not present

## 2018-01-31 DIAGNOSIS — I10 Essential (primary) hypertension: Secondary | ICD-10-CM | POA: Diagnosis not present

## 2018-02-07 DIAGNOSIS — H53461 Homonymous bilateral field defects, right side: Secondary | ICD-10-CM | POA: Diagnosis not present

## 2018-02-09 DIAGNOSIS — I482 Chronic atrial fibrillation, unspecified: Secondary | ICD-10-CM | POA: Diagnosis not present

## 2018-03-02 ENCOUNTER — Other Ambulatory Visit: Payer: Self-pay | Admitting: Family Medicine

## 2018-04-02 DIAGNOSIS — C61 Malignant neoplasm of prostate: Secondary | ICD-10-CM | POA: Diagnosis not present

## 2018-04-11 DIAGNOSIS — R9721 Rising PSA following treatment for malignant neoplasm of prostate: Secondary | ICD-10-CM | POA: Diagnosis not present

## 2018-04-11 DIAGNOSIS — N393 Stress incontinence (female) (male): Secondary | ICD-10-CM | POA: Diagnosis not present

## 2018-04-11 DIAGNOSIS — R351 Nocturia: Secondary | ICD-10-CM | POA: Diagnosis not present

## 2018-04-11 DIAGNOSIS — C61 Malignant neoplasm of prostate: Secondary | ICD-10-CM | POA: Diagnosis not present

## 2018-04-17 ENCOUNTER — Encounter: Payer: Self-pay | Admitting: Family Medicine

## 2018-04-17 DIAGNOSIS — I4891 Unspecified atrial fibrillation: Secondary | ICD-10-CM | POA: Diagnosis not present

## 2018-04-17 DIAGNOSIS — R911 Solitary pulmonary nodule: Secondary | ICD-10-CM | POA: Diagnosis not present

## 2018-04-18 DIAGNOSIS — H409 Unspecified glaucoma: Secondary | ICD-10-CM | POA: Diagnosis present

## 2018-04-18 DIAGNOSIS — Z7952 Long term (current) use of systemic steroids: Secondary | ICD-10-CM | POA: Diagnosis not present

## 2018-04-18 DIAGNOSIS — C61 Malignant neoplasm of prostate: Secondary | ICD-10-CM | POA: Diagnosis present

## 2018-04-18 DIAGNOSIS — H269 Unspecified cataract: Secondary | ICD-10-CM | POA: Diagnosis present

## 2018-04-18 DIAGNOSIS — Z888 Allergy status to other drugs, medicaments and biological substances status: Secondary | ICD-10-CM | POA: Diagnosis not present

## 2018-04-18 DIAGNOSIS — Z923 Personal history of irradiation: Secondary | ICD-10-CM | POA: Diagnosis not present

## 2018-04-18 DIAGNOSIS — Z87820 Personal history of traumatic brain injury: Secondary | ICD-10-CM | POA: Diagnosis not present

## 2018-04-18 DIAGNOSIS — Z89012 Acquired absence of left thumb: Secondary | ICD-10-CM | POA: Diagnosis not present

## 2018-04-18 DIAGNOSIS — Z006 Encounter for examination for normal comparison and control in clinical research program: Secondary | ICD-10-CM | POA: Diagnosis not present

## 2018-04-18 DIAGNOSIS — Z6830 Body mass index (BMI) 30.0-30.9, adult: Secondary | ICD-10-CM | POA: Diagnosis not present

## 2018-04-18 DIAGNOSIS — I872 Venous insufficiency (chronic) (peripheral): Secondary | ICD-10-CM | POA: Diagnosis not present

## 2018-04-18 DIAGNOSIS — Z77098 Contact with and (suspected) exposure to other hazardous, chiefly nonmedicinal, chemicals: Secondary | ICD-10-CM | POA: Diagnosis present

## 2018-04-18 DIAGNOSIS — E782 Mixed hyperlipidemia: Secondary | ICD-10-CM | POA: Diagnosis present

## 2018-04-18 DIAGNOSIS — I071 Rheumatic tricuspid insufficiency: Secondary | ICD-10-CM | POA: Diagnosis not present

## 2018-04-18 DIAGNOSIS — I6523 Occlusion and stenosis of bilateral carotid arteries: Secondary | ICD-10-CM | POA: Diagnosis not present

## 2018-04-18 DIAGNOSIS — I482 Chronic atrial fibrillation, unspecified: Secondary | ICD-10-CM | POA: Diagnosis not present

## 2018-04-18 DIAGNOSIS — Z7901 Long term (current) use of anticoagulants: Secondary | ICD-10-CM | POA: Diagnosis not present

## 2018-04-18 DIAGNOSIS — Z8249 Family history of ischemic heart disease and other diseases of the circulatory system: Secondary | ICD-10-CM | POA: Diagnosis not present

## 2018-04-18 DIAGNOSIS — H9190 Unspecified hearing loss, unspecified ear: Secondary | ICD-10-CM | POA: Diagnosis present

## 2018-04-18 DIAGNOSIS — I7300 Raynaud's syndrome without gangrene: Secondary | ICD-10-CM | POA: Diagnosis present

## 2018-04-18 DIAGNOSIS — I739 Peripheral vascular disease, unspecified: Secondary | ICD-10-CM | POA: Diagnosis present

## 2018-04-18 DIAGNOSIS — E669 Obesity, unspecified: Secondary | ICD-10-CM | POA: Diagnosis present

## 2018-04-18 DIAGNOSIS — Z79899 Other long term (current) drug therapy: Secondary | ICD-10-CM | POA: Diagnosis not present

## 2018-04-18 DIAGNOSIS — I480 Paroxysmal atrial fibrillation: Secondary | ICD-10-CM | POA: Diagnosis not present

## 2018-04-18 DIAGNOSIS — I4819 Other persistent atrial fibrillation: Secondary | ICD-10-CM | POA: Diagnosis not present

## 2018-04-18 DIAGNOSIS — I10 Essential (primary) hypertension: Secondary | ICD-10-CM | POA: Diagnosis present

## 2018-04-18 DIAGNOSIS — Z85828 Personal history of other malignant neoplasm of skin: Secondary | ICD-10-CM | POA: Diagnosis not present

## 2018-04-18 DIAGNOSIS — Z91041 Radiographic dye allergy status: Secondary | ICD-10-CM | POA: Diagnosis not present

## 2018-04-18 DIAGNOSIS — H5461 Unqualified visual loss, right eye, normal vision left eye: Secondary | ICD-10-CM | POA: Diagnosis present

## 2018-04-18 DIAGNOSIS — R911 Solitary pulmonary nodule: Secondary | ICD-10-CM | POA: Diagnosis present

## 2018-04-18 DIAGNOSIS — Z8546 Personal history of malignant neoplasm of prostate: Secondary | ICD-10-CM | POA: Insufficient documentation

## 2018-04-18 HISTORY — PX: LEFT ATRIAL APPENDAGE OCCLUSION: SHX173A

## 2018-04-19 ENCOUNTER — Encounter: Payer: Self-pay | Admitting: Family Medicine

## 2018-04-19 ENCOUNTER — Other Ambulatory Visit: Payer: Self-pay | Admitting: Family Medicine

## 2018-04-19 DIAGNOSIS — E876 Hypokalemia: Secondary | ICD-10-CM

## 2018-04-19 DIAGNOSIS — Z95818 Presence of other cardiac implants and grafts: Secondary | ICD-10-CM

## 2018-04-19 DIAGNOSIS — I4819 Other persistent atrial fibrillation: Secondary | ICD-10-CM | POA: Diagnosis not present

## 2018-04-19 DIAGNOSIS — I482 Chronic atrial fibrillation, unspecified: Secondary | ICD-10-CM | POA: Diagnosis not present

## 2018-04-19 HISTORY — DX: Presence of other cardiac implants and grafts: Z95.818

## 2018-04-19 MED ORDER — GENERIC EXTERNAL MEDICATION
1.00 | Status: DC
Start: ? — End: 2018-04-19

## 2018-04-19 MED ORDER — HYDROCHLOROTHIAZIDE 25 MG PO TABS
25.00 | ORAL_TABLET | ORAL | Status: DC
Start: 2018-04-20 — End: 2018-04-19

## 2018-04-19 MED ORDER — DILTIAZEM HCL ER COATED BEADS 180 MG PO CP24
180.00 | ORAL_CAPSULE | ORAL | Status: DC
Start: 2018-04-20 — End: 2018-04-19

## 2018-04-19 MED ORDER — METOPROLOL SUCCINATE ER 50 MG PO TB24
50.00 | ORAL_TABLET | ORAL | Status: DC
Start: 2018-04-20 — End: 2018-04-19

## 2018-04-19 MED ORDER — ACETAMINOPHEN 325 MG PO TABS
650.00 | ORAL_TABLET | ORAL | Status: DC
Start: ? — End: 2018-04-19

## 2018-04-19 MED ORDER — DABIGATRAN ETEXILATE MESYLATE 150 MG PO CAPS
150.00 | ORAL_CAPSULE | ORAL | Status: DC
Start: 2018-04-19 — End: 2018-04-19

## 2018-04-19 MED ORDER — FINASTERIDE 5 MG PO TABS
5.00 | ORAL_TABLET | ORAL | Status: DC
Start: 2018-04-20 — End: 2018-04-19

## 2018-04-19 MED ORDER — ROSUVASTATIN CALCIUM 5 MG PO TABS
5.00 | ORAL_TABLET | ORAL | Status: DC
Start: 2018-04-19 — End: 2018-04-19

## 2018-04-19 MED ORDER — LOSARTAN POTASSIUM 50 MG PO TABS
50.00 | ORAL_TABLET | ORAL | Status: DC
Start: 2018-04-20 — End: 2018-04-19

## 2018-04-19 MED ORDER — BICALUTAMIDE 50 MG PO TABS
50.00 | ORAL_TABLET | ORAL | Status: DC
Start: 2018-04-20 — End: 2018-04-19

## 2018-04-19 MED ORDER — ONDANSETRON HCL 4 MG/2ML IJ SOLN
4.00 | INTRAMUSCULAR | Status: DC
Start: ? — End: 2018-04-19

## 2018-04-19 MED ORDER — OXYCODONE-ACETAMINOPHEN 5-325 MG PO TABS
1.00 | ORAL_TABLET | ORAL | Status: DC
Start: ? — End: 2018-04-19

## 2018-04-19 MED ORDER — EZETIMIBE 10 MG PO TABS
10.00 | ORAL_TABLET | ORAL | Status: DC
Start: 2018-04-20 — End: 2018-04-19

## 2018-04-19 NOTE — Progress Notes (Signed)
Reviewed d/c summary Updated prob list dabigatroban 150 mg BID x 6 months, then lifelong aspirin 81 mg daily

## 2018-04-20 ENCOUNTER — Telehealth: Payer: Self-pay

## 2018-04-20 NOTE — Telephone Encounter (Signed)
Transition Care Management Follow-up Telephone Call  Date of discharge and from where: 04/19/18 [facility]  How have you been since you were released from the hospital? Pt states he is doing better  Any questions or concerns? No   Items Reviewed:  Did the pt receive and understand the discharge instructions provided? Yes   Medications obtained and verified? Yes   Any new allergies since your discharge? No   Dietary orders reviewed? Yes  Do you have support at home? Yes   Functional Questionnaire: (I = Independent and D = Dependent) ADLs: I  Bathing/Dressing- I  Meal Prep- I  Eating- I  Maintaining continence- I  Transferring/Ambulation- I  Managing Meds- I  Follow up appointments reviewed:   PCP Hospital f/u appt confirmed? Yes  Scheduled to see Dr. Sanda Klein on 04/25/18 @ 11:20.  [facility] f/u appt confirmed? Yes  Scheduled to see cardiologist in March.  Are transportation arrangements needed? No   If their condition worsens, is the pt aware to call PCP or go to the Emergency Dept.? Yes  Was the patient provided with contact information for the PCP's office or ED? Yes  Was to pt encouraged to call back with questions or concerns? Yes

## 2018-04-25 ENCOUNTER — Encounter: Payer: Self-pay | Admitting: Family Medicine

## 2018-04-25 ENCOUNTER — Ambulatory Visit (INDEPENDENT_AMBULATORY_CARE_PROVIDER_SITE_OTHER): Payer: Medicare Other | Admitting: Family Medicine

## 2018-04-25 ENCOUNTER — Ambulatory Visit: Payer: Medicare Other | Admitting: Family Medicine

## 2018-04-25 VITALS — BP 128/80 | HR 85 | Temp 97.6°F | Ht 67.0 in | Wt 196.3 lb

## 2018-04-25 DIAGNOSIS — Z09 Encounter for follow-up examination after completed treatment for conditions other than malignant neoplasm: Secondary | ICD-10-CM

## 2018-04-25 DIAGNOSIS — D72829 Elevated white blood cell count, unspecified: Secondary | ICD-10-CM | POA: Diagnosis not present

## 2018-04-25 DIAGNOSIS — I482 Chronic atrial fibrillation, unspecified: Secondary | ICD-10-CM

## 2018-04-25 DIAGNOSIS — D696 Thrombocytopenia, unspecified: Secondary | ICD-10-CM

## 2018-04-25 DIAGNOSIS — Z5181 Encounter for therapeutic drug level monitoring: Secondary | ICD-10-CM | POA: Diagnosis not present

## 2018-04-25 DIAGNOSIS — I8391 Asymptomatic varicose veins of right lower extremity: Secondary | ICD-10-CM

## 2018-04-25 DIAGNOSIS — Z95818 Presence of other cardiac implants and grafts: Secondary | ICD-10-CM

## 2018-04-25 DIAGNOSIS — R0989 Other specified symptoms and signs involving the circulatory and respiratory systems: Secondary | ICD-10-CM

## 2018-04-25 DIAGNOSIS — E876 Hypokalemia: Secondary | ICD-10-CM

## 2018-04-25 DIAGNOSIS — I10 Essential (primary) hypertension: Secondary | ICD-10-CM

## 2018-04-25 MED ORDER — TIMOLOL HEMIHYDRATE 0.5 % OP SOLN: 1.0000 [drp] | Freq: Every day | OPHTHALMIC | Status: AC

## 2018-04-25 NOTE — Assessment & Plan Note (Signed)
Managed by cardiologist; s/p Watchman device, patient reports plans for them to try to do a cardioversion (per his description) after a while
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
## 2018-04-25 NOTE — Patient Instructions (Addendum)
Stop the HCTZ Return in one week for a blood pressure check and we'll get a urine that day to look for protein  Monitor your own blood pressure at home and let us know if trending up over 140 on top We got labs today  Try to follow the DASH guidelines (DASH stands for Dietary Approaches to Stop Hypertension). Try to limit the sodium in your diet to no more than 1,500mg  of sodium per day. Certainly try to not exceed 2,000 mg per day at the very most. Do not add salt when cooking or at the table.  Check the sodium amount on labels when shopping, and choose items lower in sodium when given a choice. Avoid or limit foods that already contain a lot of sodium. Eat a diet rich in fruits and vegetables and whole grains, and try to lose weight if overweight or obese  If you have not heard anything from my staff in a week about any orders/referrals/studies from today, please contact us here to follow-up [phone] Check out the information at [email or web address] entitled "Nutrition for Weight Loss: What You Need to Know about Fad Diets" Try to lose between 1-2 pounds per week by taking in fewer calories and burning off more calories You can succeed by limiting portions, limiting foods dense in calories and fat, becoming more active, and drinking 8 glasses of water a day (64 ounces) Don't skip meals, especially breakfast, as skipping meals may alter your metabolism Do not use over-the-counter weight loss pills or gimmicks that claim rapid weight loss A healthy BMI (or body mass index) is between 18.5 and 24.9 You can calculate your ideal BMI at the [place] website ClubMonetize.fr   Obesity, Adult Obesity is the condition of having too much total body fat. Being overweight or obese means that your weight is greater than what is considered healthy for your body size. Obesity is determined by a measurement called BMI. BMI is an estimate of body fat and is  calculated from height and weight. For adults, a BMI of 30 or higher is considered obese. Obesity can eventually lead to other health concerns and major illnesses, including:  Stroke.  Coronary artery disease (CAD).  Type 2 diabetes.  Some types of cancer, including cancers of the colon, breast, uterus, and gallbladder.  Osteoarthritis.  High blood pressure (hypertension).  High cholesterol.  Sleep apnea.  Gallbladder stones.  Infertility problems. What are the causes? The main cause of obesity is taking in (consuming) more calories than your body uses for energy. Other factors that contribute to this condition may include:  Being born with genes that make you more likely to become obese.  Having a medical condition that causes obesity. These conditions include: ? Hypothyroidism. ? Polycystic ovarian syndrome (PCOS). ? Binge-eating disorder. ? Cushing syndrome.  Taking certain medicines, such as steroids, antidepressants, and seizure medicines.  Not being physically active (sedentary lifestyle).  Living where there are limited places to exercise safely or buy healthy foods.  Not getting enough sleep. What increases the risk? The following factors may increase your risk of this condition:  Having a family history of obesity.  Being a woman of African-American descent.  Being a man of Hispanic descent. What are the signs or symptoms? Having excessive body fat is the main symptom of this condition. How is this diagnosed? This condition may be diagnosed based on:  Your symptoms.  Your medical history.  A physical exam. Your health care provider may measure: ? Your BMI.  If you are an adult with a BMI between 25 and less than 30, you are considered overweight. If you are an adult with a BMI of 30 or higher, you are considered obese. ? The distances around your hips and your waist (circumferences). These may be compared to each other to help diagnose your  condition. ? Your skinfold thickness. Your health care provider may gently pinch a fold of your skin and measure it. How is this treated? Treatment for this condition often includes changing your lifestyle. Treatment may include some or all of the following:  Dietary changes. Work with your health care provider and a dietitian to set a weight-loss goal that is healthy and reasonable for you. Dietary changes may include eating: ? Smaller portions. A portion size is the amount of a particular food that is healthy for you to eat at one time. This varies from person to person. ? Low-calorie or low-fat options. ? More whole grains, fruits, and vegetables.  Regular physical activity. This may include aerobic activity (cardio) and strength training.  Medicine to help you lose weight. Your health care provider may prescribe medicine if you are unable to lose 1 pound a week after 6 weeks of eating more healthily and doing more physical activity.  Surgery. Surgical options may include gastric banding and gastric bypass. Surgery may be done if: ? Other treatments have not helped to improve your condition. ? You have a BMI of 40 or higher. ? You have life-threatening health problems related to obesity. Follow these instructions at home:  Eating and drinking   Follow recommendations from your health care provider about what you eat and drink. Your health care provider may advise you to: ? Limit fast foods, sweets, and processed snack foods. ? Choose low-fat options, such as low-fat milk instead of whole milk. ? Eat 5 or more servings of fruits or vegetables every day. ? Eat at home more often. This gives you more control over what you eat. ? Choose healthy foods when you eat out. ? Learn what a healthy portion size is. ? Keep low-fat snacks on hand. ? Avoid sugary drinks, such as soda, fruit juice, iced tea sweetened with sugar, and flavored milk. ? Eat a healthy breakfast.  Drink enough water  to keep your urine clear or pale yellow.  Do not go without eating for long periods of time (do not fast) or follow a fad diet. Fasting and fad diets can be unhealthy and even dangerous. Physical Activity  Exercise regularly, as told by your health care provider. Ask your health care provider what types of exercise are safe for you and how often you should exercise.  Warm up and stretch before being active.  Cool down and stretch after being active.  Rest between periods of activity. Lifestyle  Limit the time that you spend in front of your TV, computer, or video game system.  Find ways to reward yourself that do not involve food.  Limit alcohol intake to no more than 1 drink a day for nonpregnant women and 2 drinks a day for men. One drink equals 12 oz of beer, 5 oz of wine, or 1 oz of hard liquor. General instructions  Keep a weight loss journal to keep track of the food you eat and how much you exercise you get.  Take over-the-counter and prescription medicines only as told by your health care provider.  Take vitamins and supplements only as told by your health care provider.  Consider  joining a support group. Your health care provider may be able to recommend a support group.  Keep all follow-up visits as told by your health care provider. This is important. Contact a health care provider if:  You are unable to meet your weight loss goal after 6 weeks of dietary and lifestyle changes. This information is not intended to replace advice given to you by your health care provider. Make sure you discuss any questions you have with your health care provider. Document Released: 04/21/2004 Document Revised: 08/17/2015 Document Reviewed: 12/31/2014 Elsevier Interactive Patient Education  2019 Elsevier Inc.  Preventing Unhealthy [employer], Adult Staying at a healthy weight is important to your overall health. When fat builds up in your body, you may become overweight or obese.  Being overweight or obese increases your risk of developing certain health problems, such as heart disease, diabetes, sleeping problems, joint problems, and some types of cancer. Unhealthy weight gain is often the result of making unhealthy food choices or not getting enough exercise. You can make changes to your lifestyle to prevent obesity and stay as healthy as possible. What nutrition changes can be made?   Eat only as much as your body needs. To do this: ? Pay attention to signs that you are hungry or full. Stop eating as soon as you feel full. ? If you feel hungry, try drinking water first before eating. Drink enough water so your urine is clear or pale yellow. ? Eat smaller portions. Pay attention to portion sizes when eating out. ? Look at serving sizes on food labels. Most foods contain more than one serving per container. ? Eat the recommended number of calories for your gender and activity level. For most active people, a daily total of 2,000 calories is appropriate. If you are trying to lose weight or are not very active, you may need to eat fewer calories. Talk with your health care provider or a diet and nutrition specialist (dietitian) about how many calories you need each day.  Choose healthy foods, such as: ? Fruits and vegetables. At each meal, try to fill at least half of your plate with fruits and vegetables. ? Whole grains, such as whole-wheat bread, brown rice, and quinoa. ? Lean meats, such as chicken or fish. ? Other healthy proteins, such as beans, eggs, or tofu. ? Healthy fats, such as nuts, seeds, fatty fish, and olive oil. ? Low-fat or fat-free dairy products.  Check food labels, and avoid food and drinks that: ? Are high in calories. ? Have added sugar. ? Are high in sodium. ? Have saturated fats or trans fats.  Cook foods in healthier ways, such as by baking, broiling, or grilling.  Make a meal plan for the week, and shop with a grocery list to help you stay  on track with your purchases. Try to avoid going to the grocery store when you are hungry.  When grocery shopping, try to shop around the outside of the store first, where the fresh foods are. Doing this helps you to avoid prepackaged foods, which can be high in sugar, salt (sodium), and fat. What lifestyle changes can be made?   Exercise for 30 or more minutes on 5 or more days each week. Exercising may include brisk walking, yard work, biking, running, swimming, and team sports like basketball and soccer. Ask your health care provider which exercises are safe for you.  Do muscle-strengthening activities, such as lifting weights or using resistance bands, on 2 or more  days a week.  Do not use any products that contain nicotine or tobacco, such as cigarettes and e-cigarettes. If you need help quitting, ask your health care provider.  Limit alcohol intake to no more than 1 drink a day for nonpregnant women and 2 drinks a day for men. One drink equals 12 oz of beer, 5 oz of wine, or 1 oz of hard liquor.  Try to get 7-9 hours of sleep each night. What other changes can be made?  Keep a food and activity journal to keep track of: ? What you ate and how many calories you had. Remember to count the calories in sauces, dressings, and side dishes. ? Whether you were active, and what exercises you did. ? Your calorie, weight, and activity goals.  Check your weight regularly. Track any changes. If you notice you have gained weight, make changes to your diet or activity routine.  Avoid taking weight-loss medicines or supplements. Talk to your health care provider before starting any new medicine or supplement.  Talk to your health care provider before trying any new diet or exercise plan. Why are these changes important? Eating healthy, staying active, and having healthy habits can help you to prevent obesity. Those changes also:  Help you manage stress and emotions.  Help you connect with  friends and family.  Improve your self-esteem.  Improve your sleep.  Prevent long-term health problems. What can happen if changes are not made? Being obese or overweight can cause you to develop joint or bone problems, which can make it hard for you to stay active or do activities you enjoy. Being obese or overweight also puts stress on your heart and lungs and can lead to health problems like diabetes, heart disease, and some cancers. Where to find more information Talk with your health care provider or a dietitian about healthy eating and healthy lifestyle choices. You may also find information from:  U.S. Department of Agriculture, MyPlate: FormerBoss.no  American Heart Association: www.heart.org  Centers for Disease Control and Prevention: http://www.wolf.info/ Summary  Staying at a healthy weight is important to your overall health. It helps you to prevent certain diseases and health problems, such as heart disease, diabetes, joint problems, sleep disorders, and some types of cancer.  Being obese or overweight can cause you to develop joint or bone problems, which can make it hard for you to stay active or do activities you enjoy.  You can prevent unhealthy weight gain by eating a healthy diet, exercising regularly, not smoking, limiting alcohol, and getting enough sleep.  Talk with your health care provider or a dietitian for guidance about healthy eating and healthy lifestyle choices. This information is not intended to replace advice given to you by your health care provider. Make sure you discuss any questions you have with your health care provider. Document Released: 03/15/2016 Document Revised: 12/23/2016 Document Reviewed: 04/20/2016 Elsevier Interactive Patient Education  2019 [employer].

## 2018-04-25 NOTE — Assessment & Plan Note (Signed)
Controlled today off of hctz for the last week; likely contributed to his low K+; will have him return in one week for BP recheck

## 2018-04-25 NOTE — Assessment & Plan Note (Signed)
Placed at [facility]; records reviewed; explained to patient that their notes clearly state that he should take Pradaxa for six months, not just one month; if cost is an issue, he should contact his cardiologist to see if he can get samples or perhaps get patient assistance; he agrees

## 2018-04-26 ENCOUNTER — Ambulatory Visit (INDEPENDENT_AMBULATORY_CARE_PROVIDER_SITE_OTHER): Payer: Medicare Other

## 2018-04-26 VITALS — BP 130/80 | HR 77 | Temp 97.4°F | Resp 16 | Ht 67.0 in | Wt 196.8 lb

## 2018-04-26 DIAGNOSIS — M75120 Complete rotator cuff tear or rupture of unspecified shoulder, not specified as traumatic: Secondary | ICD-10-CM | POA: Insufficient documentation

## 2018-04-26 DIAGNOSIS — M7550 Bursitis of unspecified shoulder: Secondary | ICD-10-CM

## 2018-04-26 DIAGNOSIS — Z0000 Encounter for general adult medical examination without abnormal findings: Secondary | ICD-10-CM

## 2018-04-26 DIAGNOSIS — S43409A Unspecified sprain of unspecified shoulder joint, initial encounter: Secondary | ICD-10-CM | POA: Insufficient documentation

## 2018-04-26 DIAGNOSIS — R911 Solitary pulmonary nodule: Secondary | ICD-10-CM | POA: Insufficient documentation

## 2018-04-26 DIAGNOSIS — M719 Bursopathy, unspecified: Secondary | ICD-10-CM | POA: Insufficient documentation

## 2018-04-26 LAB — CBC WITH DIFFERENTIAL/PLATELET
Absolute Monocytes: 1220 cells/uL — ABNORMAL HIGH (ref 200–950)
Basophils Absolute: 86 cells/uL (ref 0–200)
Basophils Relative: 0.8 %
EOS PCT: 1 %
Eosinophils Absolute: 108 cells/uL (ref 15–500)
HEMATOCRIT: 41.6 % (ref 38.5–50.0)
Hemoglobin: 14.1 g/dL (ref 13.2–17.1)
Lymphs Abs: 1782 cells/uL (ref 850–3900)
MCH: 32.7 pg (ref 27.0–33.0)
MCHC: 33.9 g/dL (ref 32.0–36.0)
MCV: 96.5 fL (ref 80.0–100.0)
MPV: 13.5 fL — ABNORMAL HIGH (ref 7.5–12.5)
Monocytes Relative: 11.3 %
NEUTROS PCT: 70.4 %
Neutro Abs: 7603 cells/uL (ref 1500–7800)
Platelets: 167 10*3/uL (ref 140–400)
RBC: 4.31 10*6/uL (ref 4.20–5.80)
RDW: 12.8 % (ref 11.0–15.0)
TOTAL LYMPHOCYTE: 16.5 %
WBC: 10.8 10*3/uL (ref 3.8–10.8)

## 2018-04-26 LAB — BASIC METABOLIC PANEL
BUN: 19 mg/dL (ref 7–25)
CALCIUM: 9 mg/dL (ref 8.6–10.3)
CHLORIDE: 107 mmol/L (ref 98–110)
CO2: 19 mmol/L — AB (ref 20–32)
Creat: 0.93 mg/dL (ref 0.70–1.25)
GLUCOSE: 91 mg/dL (ref 65–99)
Potassium: 4.4 mmol/L (ref 3.5–5.3)
SODIUM: 139 mmol/L (ref 135–146)

## 2018-04-26 LAB — MAGNESIUM: Magnesium: 1.9 mg/dL (ref 1.5–2.5)

## 2018-04-26 NOTE — Patient Instructions (Signed)
Michael Cantrell , Thank you for taking time to come for your Medicare Wellness Visit. I appreciate your ongoing commitment to your health goals. Please review the following plan we discussed and let me know if I can assist you in the future.   Screening recommendations/referrals: Colonoscopy: done 05/20/16. Repeat in 2023. Recommended yearly ophthalmology/optometry visit for glaucoma screening and checkup Recommended yearly dental visit for hygiene and checkup  Vaccinations: Influenza vaccine: postponed Pneumococcal vaccine: done 08/12/16 Tdap vaccine: done 01/25/17 Shingles vaccine: Shingrix series completed 2019.   Advanced directives: Advance directive discussed with you today. I have provided a copy for you to complete at home and have notarized. Once this is complete please bring a copy in to our office so we can scan it into your chart.  Conditions/risks identified: Recommend healthy eating and physical activity for weight loss goal.  Next appointment: Please follow up in one year for your Medicare Annual Wellness visit.    Preventive Care 68 Years and Older, Male Preventive care refers to lifestyle choices and visits with your health care provider that can promote health and wellness. What does preventive care include?  A yearly physical exam. This is also called an annual well check.  Dental exams once or twice a year.  Routine eye exams. Ask your health care provider how often you should have your eyes checked.  Personal lifestyle choices, including:  Daily care of your teeth and gums.  Regular physical activity.  Eating a healthy diet.  Avoiding tobacco and drug use.  Limiting alcohol use.  Practicing safe sex.  Taking low doses of aspirin every day.  Taking vitamin and mineral supplements as recommended by your health care provider. What happens during an annual well check? The services and screenings done by your health care provider during your annual well check  will depend on your age, overall health, lifestyle risk factors, and family history of disease. Counseling  Your health care provider may ask you questions about your:  Alcohol use.  Tobacco use.  Drug use.  Emotional well-being.  Home and relationship well-being.  Sexual activity.  Eating habits.  History of falls.  Memory and ability to understand (cognition).  Work and work Statistician. Screening  You may have the following tests or measurements:  Height, weight, and BMI.  Blood pressure.  Lipid and cholesterol levels. These may be checked every 5 years, or more frequently if you are over 68 years old.  Skin check.  Lung cancer screening. You may have this screening every year starting at age 68 if you have a 30-pack-year history of smoking and currently smoke or have quit within the past 15 years.  Fecal occult blood test (FOBT) of the stool. You may have this test every year starting at age 68.  Flexible sigmoidoscopy or colonoscopy. You may have a sigmoidoscopy every 5 years or a colonoscopy every 10 years starting at age 68.  Prostate cancer screening. Recommendations will vary depending on your family history and other risks.  Hepatitis C blood test.  Hepatitis B blood test.  Sexually transmitted disease (STD) testing.  Diabetes screening. This is done by checking your blood sugar (glucose) after you have not eaten for a while (fasting). You may have this done every 1-3 years.  Abdominal aortic aneurysm (AAA) screening. You may need this if you are a current or former smoker.  Osteoporosis. You may be screened starting at age 70 if you are at high risk. Talk with your health care provider about  your test results, treatment options, and if necessary, the need for more tests. Vaccines  Your health care provider may recommend certain vaccines, such as:  Influenza vaccine. This is recommended every year.  Tetanus, diphtheria, and acellular pertussis  (Tdap, Td) vaccine. You may need a Td booster every 10 years.  Zoster vaccine. You may need this after age 59.  Pneumococcal 13-valent conjugate (PCV13) vaccine. One dose is recommended after age 31.  Pneumococcal polysaccharide (PPSV23) vaccine. One dose is recommended after age 52. Talk to your health care provider about which screenings and vaccines you need and how often you need them. This information is not intended to replace advice given to you by your health care provider. Make sure you discuss any questions you have with your health care provider. Document Released: 04/10/2015 Document Revised: 12/02/2015 Document Reviewed: 01/13/2015 Elsevier Interactive Patient Education  2017 [place] Prevention in the Home Falls can cause injuries. They can happen to people of all ages. There are many things you can do to make your home safe and to help prevent falls. What can I do on the outside of my home?  Regularly fix the edges of walkways and driveways and fix any cracks.  Remove anything that might make you trip as you walk through a door, such as a raised step or threshold.  Trim any bushes or trees on the path to your home.  Use bright outdoor lighting.  Clear any walking paths of anything that might make someone trip, such as rocks or tools.  Regularly check to see if handrails are loose or broken. Make sure that both sides of any steps have handrails.  Any raised decks and porches should have guardrails on the edges.  Have any leaves, snow, or ice cleared regularly.  Use sand or salt on walking paths during winter.  Clean up any spills in your garage right away. This includes oil or grease spills. What can I do in the bathroom?  Use night lights.  Install grab bars by the toilet and in the tub and shower. Do not use towel bars as grab bars.  Use non-skid mats or decals in the tub or shower.  If you need to sit down in the shower, use a plastic, non-slip  stool.  Keep the floor dry. Clean up any water that spills on the floor as soon as it happens.  Remove soap buildup in the tub or shower regularly.  Attach bath mats securely with double-sided non-slip rug tape.  Do not have throw rugs and other things on the floor that can make you trip. What can I do in the bedroom?  Use night lights.  Make sure that you have a light by your bed that is easy to reach.  Do not use any sheets or blankets that are too big for your bed. They should not hang down onto the floor.  Have a firm chair that has side arms. You can use this for support while you get dressed.  Do not have throw rugs and other things on the floor that can make you trip. What can I do in the kitchen?  Clean up any spills right away.  Avoid walking on wet floors.  Keep items that you use a lot in easy-to-reach places.  If you need to reach something above you, use a strong step stool that has a grab bar.  Keep electrical cords out of the way.  Do not use floor polish or wax  that makes floors slippery. If you must use wax, use non-skid floor wax.  Do not have throw rugs and other things on the floor that can make you trip. What can I do with my stairs?  Do not leave any items on the stairs.  Make sure that there are handrails on both sides of the stairs and use them. Fix handrails that are broken or loose. Make sure that handrails are as long as the stairways.  Check any carpeting to make sure that it is firmly attached to the stairs. Fix any carpet that is loose or worn.  Avoid having throw rugs at the top or bottom of the stairs. If you do have throw rugs, attach them to the floor with carpet tape.  Make sure that you have a light switch at the top of the stairs and the bottom of the stairs. If you do not have them, ask someone to add them for you. What else can I do to help prevent falls?  Wear shoes that:  Do not have high heels.  Have rubber bottoms.  Are  comfortable and fit you well.  Are closed at the toe. Do not wear sandals.  If you use a stepladder:  Make sure that it is fully opened. Do not climb a closed stepladder.  Make sure that both sides of the stepladder are locked into place.  Ask someone to hold it for you, if possible.  Clearly mark and make sure that you can see:  Any grab bars or handrails.  First and last steps.  Where the edge of each step is.  Use tools that help you move around (mobility aids) if they are needed. These include:  Canes.  Walkers.  Scooters.  Crutches.  Turn on the lights when you go into a dark area. Replace any light bulbs as soon as they burn out.  Set up your furniture so you have a clear path. Avoid moving your furniture around.  If any of your floors are uneven, fix them.  If there are any pets around you, be aware of where they are.  Review your medicines with your doctor. Some medicines can make you feel dizzy. This can increase your chance of falling. Ask your doctor what other things that you can do to help prevent falls. This information is not intended to replace advice given to you by your health care provider. Make sure you discuss any questions you have with your health care provider. Document Released: 01/08/2009 Document Revised: 08/20/2015 Document Reviewed: 04/18/2014 Elsevier Interactive Patient Education  2017 [employer].
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
## 2018-04-27 ENCOUNTER — Encounter: Payer: Self-pay | Admitting: Intensive Care

## 2018-04-27 ENCOUNTER — Other Ambulatory Visit: Payer: Self-pay

## 2018-04-27 ENCOUNTER — Emergency Department
Admission: EM | Admit: 2018-04-27 | Discharge: 2018-04-27 | Disposition: A | Discharge status: Home / Self Care | Payer: Medicare Other | Attending: Emergency Medicine | Admitting: Emergency Medicine

## 2018-04-27 DIAGNOSIS — Z7901 Long term (current) use of anticoagulants: Secondary | ICD-10-CM | POA: Diagnosis not present

## 2018-04-27 DIAGNOSIS — Z79899 Other long term (current) drug therapy: Secondary | ICD-10-CM | POA: Insufficient documentation

## 2018-04-27 DIAGNOSIS — R310 Gross hematuria: Secondary | ICD-10-CM | POA: Diagnosis not present

## 2018-04-27 DIAGNOSIS — R319 Hematuria, unspecified: Secondary | ICD-10-CM | POA: Diagnosis present

## 2018-04-27 LAB — BASIC METABOLIC PANEL
Anion gap: 9 (ref 5–15)
BUN: 15 mg/dL (ref 8–23)
CO2: 19 mmol/L — ABNORMAL LOW (ref 22–32)
Calcium: 9 mg/dL (ref 8.9–10.3)
Chloride: 108 mmol/L (ref 98–111)
Creatinine, Ser: 0.99 mg/dL (ref 0.61–1.24)
GFR calc Af Amer: >60 mL/min (ref >60)
GFR calc non Af Amer: >60 mL/min (ref >60)
GLUCOSE: 84 mg/dL (ref 70–99)
Potassium: 4.4 mmol/L (ref 3.5–5.1)
Sodium: 136 mmol/L (ref 135–145)

## 2018-04-27 LAB — URINALYSIS, COMPLETE (UACMP) WITH MICROSCOPIC
Bilirubin Urine: NEGATIVE
Glucose, UA: NEGATIVE mg/dL
Ketones, ur: 5 mg/dL — AB
Leukocytes, UA: NEGATIVE
NITRITE: NEGATIVE
PROTEIN: 30 mg/dL — AB
RBC / HPF: >50 RBC/hpf — ABNORMAL HIGH (ref 0–5)
Specific Gravity, Urine: 1.015 (ref 1.005–1.030)
pH: 5 (ref 5.0–8.0)

## 2018-04-27 LAB — CBC
HEMATOCRIT: 40.1 % (ref 39.0–52.0)
HEMOGLOBIN: 13.1 g/dL (ref 13.0–17.0)
MCH: 32.6 pg (ref 26.0–34.0)
MCHC: 32.7 g/dL (ref 30.0–36.0)
MCV: 99.8 fL (ref 80.0–100.0)
Platelets: 154 10*3/uL (ref 150–400)
RBC: 4.02 MIL/uL — ABNORMAL LOW (ref 4.22–5.81)
RDW: 13.9 % (ref 11.5–15.5)
WBC: 11.1 10*3/uL — ABNORMAL HIGH (ref 4.0–10.5)
nRBC: 0 % (ref 0.0–0.2)
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
## 2018-04-27 NOTE — ED Triage Notes (Signed)
Had watchman put inside heart X9 days ago. Last night started having blood in urine and cleared up and then started back around 4pm. Taking blood thinner daily

## 2018-04-27 NOTE — ED Triage Notes (Signed)
First Nurse Note:  Patient reports blood in urine and 6 or 7 days post heart surgery.  Patient takes blood thinners.

## 2018-04-27 NOTE — ED Notes (Signed)
Pt reports bright red bleeding upon urination and a steady light "seeping" of blood. Denies inc of pain upon urination. Pt still urinating a normal amount per pt. Stopped hydrochlorothiazide last week d/t low K+ level.

## 2018-04-27 NOTE — ED Notes (Signed)
Lav/Grn tops collected and sent to lab.

## 2018-04-29 LAB — URINE CULTURE: Culture: <10000 — AB

## 2018-05-02 ENCOUNTER — Other Ambulatory Visit: Payer: Self-pay | Admitting: Family Medicine

## 2018-05-02 ENCOUNTER — Ambulatory Visit: Payer: Medicare Other

## 2018-05-02 VITALS — BP 138/72 | HR 76

## 2018-05-02 DIAGNOSIS — I10 Essential (primary) hypertension: Secondary | ICD-10-CM

## 2018-05-02 MED ORDER — LOSARTAN POTASSIUM 100 MG PO TABS: 100.0000 mg | ORAL_TABLET | Freq: Every day | ORAL | 0 refills | Status: DC

## 2018-05-02 NOTE — Progress Notes (Signed)
Patient is here for a BP check. BP today in the right arm is 138/72, 134/70 in the left. HR is 76. HCTZ was stopped, patient denies and symptoms or side effects. Consulted with Dr. Sanda Klein and she advised that patient increase his Losartan from 50mg  to 100mg  and come back in 1 week for a BP check with CMA and a BMP.

## 2018-05-02 NOTE — Progress Notes (Signed)
New Rx sent in for 100 mg losartan He will return in one week for BP check and BMP with CMA

## 2018-05-07 ENCOUNTER — Encounter (INDEPENDENT_AMBULATORY_CARE_PROVIDER_SITE_OTHER): Payer: Self-pay | Admitting: Vascular Surgery

## 2018-05-07 ENCOUNTER — Ambulatory Visit (INDEPENDENT_AMBULATORY_CARE_PROVIDER_SITE_OTHER): Payer: Medicare Other | Admitting: Vascular Surgery

## 2018-05-07 ENCOUNTER — Other Ambulatory Visit: Payer: Self-pay

## 2018-05-07 VITALS — BP 122/70 | HR 70 | Resp 12 | Ht 67.0 in | Wt 198.0 lb

## 2018-05-07 DIAGNOSIS — I872 Venous insufficiency (chronic) (peripheral): Secondary | ICD-10-CM | POA: Diagnosis not present

## 2018-05-07 DIAGNOSIS — I482 Chronic atrial fibrillation, unspecified: Secondary | ICD-10-CM | POA: Diagnosis not present

## 2018-05-07 DIAGNOSIS — I739 Peripheral vascular disease, unspecified: Secondary | ICD-10-CM

## 2018-05-07 DIAGNOSIS — I10 Essential (primary) hypertension: Secondary | ICD-10-CM

## 2018-05-07 DIAGNOSIS — I6523 Occlusion and stenosis of bilateral carotid arteries: Secondary | ICD-10-CM | POA: Diagnosis not present

## 2018-05-07 DIAGNOSIS — I83813 Varicose veins of bilateral lower extremities with pain: Secondary | ICD-10-CM

## 2018-05-07 DIAGNOSIS — I83819 Varicose veins of unspecified lower extremities with pain: Secondary | ICD-10-CM
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
## 2018-05-08 DIAGNOSIS — H401131 Primary open-angle glaucoma, bilateral, mild stage: Secondary | ICD-10-CM | POA: Diagnosis not present

## 2018-05-09 ENCOUNTER — Ambulatory Visit (INDEPENDENT_AMBULATORY_CARE_PROVIDER_SITE_OTHER): Payer: Medicare Other

## 2018-05-09 VITALS — BP 132/68 | HR 81

## 2018-05-09 DIAGNOSIS — E876 Hypokalemia: Secondary | ICD-10-CM

## 2018-05-09 DIAGNOSIS — I10 Essential (primary) hypertension: Secondary | ICD-10-CM

## 2018-05-09 LAB — BASIC METABOLIC PANEL WITH GFR
BUN: 18 mg/dL (ref 7–25)
CO2: 22 mmol/L (ref 20–32)
Calcium: 9.3 mg/dL (ref 8.6–10.3)
Chloride: 107 mmol/L (ref 98–110)
Creat: 1.11 mg/dL (ref 0.70–1.25)
GFR, Est African American: 79 mL/min/{1.73_m2} (ref >=60)
GFR, Est Non African American: 68 mL/min/{1.73_m2} (ref >=60)
Glucose, Bld: 81 mg/dL (ref 65–99)
Potassium: 4.4 mmol/L (ref 3.5–5.3)
Sodium: 139 mmol/L (ref 135–146)

## 2018-05-09 NOTE — Progress Notes (Signed)
Patient is here for a blood pressure check. Patient denies chest pain, palpitations, shortness of breath or visual disturbances. At previous visit blood pressure was 138/72 with a heart rate of 76. Today during nurse visit first check blood pressure was 134/68 in right arm. After resting for 10 minutes it was 132/68 and heart rate was. He does take any blood pressure medications.  Has been taking 75 mg of Losartan in the morning around 7:30 a.m. Went to see Vein and Vascular doctor on Monday and aware of his decrease pulse in his left arm-they are going to perform a MRI to check for blockages with his left arm.. Last visit Dr. Sanda Klein stopped his HCTZ medication states he does a little bit of edema in bilateral ankles-but not as severe as before.   Dr. Sanda Klein states we will go off his right arm BP reading and instructed patient to stay on the 75 mg of Losartan and to keep follow up with Vein and Vascular physicians.

## 2018-05-10 ENCOUNTER — Other Ambulatory Visit: Payer: Self-pay | Admitting: Family Medicine

## 2018-05-10 DIAGNOSIS — H401131 Primary open-angle glaucoma, bilateral, mild stage: Secondary | ICD-10-CM | POA: Diagnosis not present

## 2018-05-10 MED ORDER — LOSARTAN POTASSIUM 50 MG PO TABS: 75.0000 mg | ORAL_TABLET | Freq: Every day | ORAL | 1 refills | Status: DC

## 2018-05-10 NOTE — Progress Notes (Signed)
Michael Cantrell, please let the patient know that his labs look fine with the blood pressure medicine, continue course; thank you

## 2018-05-10 NOTE — Progress Notes (Signed)
Now on 75 mg losartan; doing well, normal bmp; refills sent

## 2018-05-16 ENCOUNTER — Encounter (INDEPENDENT_AMBULATORY_CARE_PROVIDER_SITE_OTHER): Payer: Self-pay | Admitting: Vascular Surgery

## 2018-05-16 DIAGNOSIS — I739 Peripheral vascular disease, unspecified: Secondary | ICD-10-CM | POA: Insufficient documentation

## 2018-05-16 DIAGNOSIS — I83819 Varicose veins of unspecified lower extremities with pain: Secondary | ICD-10-CM | POA: Insufficient documentation

## 2018-05-24 ENCOUNTER — Other Ambulatory Visit (INDEPENDENT_AMBULATORY_CARE_PROVIDER_SITE_OTHER): Payer: Self-pay | Admitting: Vascular Surgery

## 2018-05-24 ENCOUNTER — Other Ambulatory Visit: Payer: Self-pay | Admitting: Family Medicine

## 2018-05-24 ENCOUNTER — Ambulatory Visit (INDEPENDENT_AMBULATORY_CARE_PROVIDER_SITE_OTHER): Payer: Medicare Other

## 2018-05-24 ENCOUNTER — Encounter (INDEPENDENT_AMBULATORY_CARE_PROVIDER_SITE_OTHER): Payer: Self-pay | Admitting: Vascular Surgery

## 2018-05-24 ENCOUNTER — Ambulatory Visit (INDEPENDENT_AMBULATORY_CARE_PROVIDER_SITE_OTHER): Payer: Medicare Other | Admitting: Vascular Surgery

## 2018-05-24 ENCOUNTER — Other Ambulatory Visit: Payer: Self-pay

## 2018-05-24 VITALS — BP 131/71 | HR 78 | Resp 17 | Ht 67.0 in | Wt 195.0 lb

## 2018-05-24 DIAGNOSIS — I83819 Varicose veins of unspecified lower extremities with pain: Secondary | ICD-10-CM

## 2018-05-24 DIAGNOSIS — I872 Venous insufficiency (chronic) (peripheral): Secondary | ICD-10-CM

## 2018-05-24 DIAGNOSIS — I482 Chronic atrial fibrillation, unspecified: Secondary | ICD-10-CM

## 2018-05-24 DIAGNOSIS — E782 Mixed hyperlipidemia: Secondary | ICD-10-CM | POA: Diagnosis not present

## 2018-05-24 DIAGNOSIS — I739 Peripheral vascular disease, unspecified: Secondary | ICD-10-CM

## 2018-05-24 DIAGNOSIS — I6523 Occlusion and stenosis of bilateral carotid arteries: Secondary | ICD-10-CM

## 2018-05-24 DIAGNOSIS — I10 Essential (primary) hypertension: Secondary | ICD-10-CM

## 2018-05-24 NOTE — Telephone Encounter (Signed)
Rx request received for 100 mg losartan Not appropriate -- denied He's on 75 mg daily now from Rx dated 05/10/18 Please resolve with pharmacy
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
## 2018-05-25 NOTE — Telephone Encounter (Signed)
Resolved with pharmacy

## 2018-05-30 DIAGNOSIS — I10 Essential (primary) hypertension: Secondary | ICD-10-CM | POA: Diagnosis not present

## 2018-05-30 DIAGNOSIS — E782 Mixed hyperlipidemia: Secondary | ICD-10-CM | POA: Diagnosis not present

## 2018-05-30 DIAGNOSIS — I482 Chronic atrial fibrillation, unspecified: Secondary | ICD-10-CM | POA: Diagnosis not present

## 2018-05-30 DIAGNOSIS — I6523 Occlusion and stenosis of bilateral carotid arteries: Secondary | ICD-10-CM | POA: Diagnosis not present

## 2018-05-30 DIAGNOSIS — Z95818 Presence of other cardiac implants and grafts: Secondary | ICD-10-CM | POA: Diagnosis not present

## 2018-06-06 DIAGNOSIS — H04221 Epiphora due to insufficient drainage, right lacrimal gland: Secondary | ICD-10-CM | POA: Diagnosis not present

## 2018-06-12 DIAGNOSIS — I482 Chronic atrial fibrillation, unspecified: Secondary | ICD-10-CM | POA: Diagnosis not present

## 2018-06-12 DIAGNOSIS — Z95818 Presence of other cardiac implants and grafts: Secondary | ICD-10-CM | POA: Diagnosis not present

## 2018-08-07 DIAGNOSIS — H04221 Epiphora due to insufficient drainage, right lacrimal gland: Secondary | ICD-10-CM | POA: Diagnosis not present

## 2018-08-13 ENCOUNTER — Encounter (INDEPENDENT_AMBULATORY_CARE_PROVIDER_SITE_OTHER): Payer: Self-pay | Admitting: Vascular Surgery

## 2018-08-13 ENCOUNTER — Other Ambulatory Visit: Payer: Self-pay

## 2018-08-13 ENCOUNTER — Ambulatory Visit (INDEPENDENT_AMBULATORY_CARE_PROVIDER_SITE_OTHER): Payer: Medicare Other | Admitting: Vascular Surgery

## 2018-08-13 VITALS — BP 151/83 | HR 64 | Resp 16 | Ht 66.5 in | Wt 201.6 lb

## 2018-08-13 DIAGNOSIS — I771 Stricture of artery: Secondary | ICD-10-CM | POA: Diagnosis not present

## 2018-08-13 DIAGNOSIS — Z79899 Other long term (current) drug therapy: Secondary | ICD-10-CM | POA: Diagnosis not present

## 2018-08-13 DIAGNOSIS — I6523 Occlusion and stenosis of bilateral carotid arteries: Secondary | ICD-10-CM | POA: Diagnosis not present

## 2018-08-13 DIAGNOSIS — I872 Venous insufficiency (chronic) (peripheral): Secondary | ICD-10-CM

## 2018-08-13 DIAGNOSIS — I890 Lymphedema, not elsewhere classified: Secondary | ICD-10-CM | POA: Diagnosis not present

## 2018-08-13 DIAGNOSIS — I482 Chronic atrial fibrillation, unspecified: Secondary | ICD-10-CM

## 2018-08-13 DIAGNOSIS — E782 Mixed hyperlipidemia: Secondary | ICD-10-CM | POA: Diagnosis not present

## 2018-08-13 DIAGNOSIS — I10 Essential (primary) hypertension: Secondary | ICD-10-CM | POA: Diagnosis not present
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
## 2018-08-15 DIAGNOSIS — L918 Other hypertrophic disorders of the skin: Secondary | ICD-10-CM | POA: Diagnosis not present

## 2018-08-15 DIAGNOSIS — D1800 Hemangioma unspecified site: Secondary | ICD-10-CM | POA: Diagnosis not present

## 2018-08-15 DIAGNOSIS — L578 Other skin changes due to chronic exposure to nonionizing radiation: Secondary | ICD-10-CM | POA: Diagnosis not present

## 2018-08-15 DIAGNOSIS — L570 Actinic keratosis: Secondary | ICD-10-CM | POA: Diagnosis not present

## 2018-08-15 DIAGNOSIS — Z1283 Encounter for screening for malignant neoplasm of skin: Secondary | ICD-10-CM | POA: Diagnosis not present

## 2018-08-15 DIAGNOSIS — D225 Melanocytic nevi of trunk: Secondary | ICD-10-CM | POA: Diagnosis not present

## 2018-08-15 DIAGNOSIS — L821 Other seborrheic keratosis: Secondary | ICD-10-CM | POA: Diagnosis not present

## 2018-09-20 DIAGNOSIS — L570 Actinic keratosis: Secondary | ICD-10-CM | POA: Diagnosis not present

## 2018-09-24 DIAGNOSIS — C61 Malignant neoplasm of prostate: Secondary | ICD-10-CM | POA: Diagnosis not present

## 2018-10-01 DIAGNOSIS — C61 Malignant neoplasm of prostate: Secondary | ICD-10-CM | POA: Diagnosis not present

## 2018-10-05 ENCOUNTER — Encounter: Payer: Self-pay | Admitting: Family Medicine

## 2018-11-15 ENCOUNTER — Encounter (INDEPENDENT_AMBULATORY_CARE_PROVIDER_SITE_OTHER): Payer: Self-pay | Admitting: Vascular Surgery

## 2018-11-15 ENCOUNTER — Other Ambulatory Visit: Payer: Self-pay

## 2018-11-15 ENCOUNTER — Ambulatory Visit (INDEPENDENT_AMBULATORY_CARE_PROVIDER_SITE_OTHER): Payer: Medicare Other

## 2018-11-15 ENCOUNTER — Ambulatory Visit (INDEPENDENT_AMBULATORY_CARE_PROVIDER_SITE_OTHER): Payer: Medicare Other | Admitting: Vascular Surgery

## 2018-11-15 VITALS — BP 148/85 | HR 71 | Resp 16 | Ht 67.0 in | Wt 202.6 lb

## 2018-11-15 DIAGNOSIS — E782 Mixed hyperlipidemia: Secondary | ICD-10-CM

## 2018-11-15 DIAGNOSIS — I771 Stricture of artery: Secondary | ICD-10-CM

## 2018-11-15 DIAGNOSIS — I739 Peripheral vascular disease, unspecified: Secondary | ICD-10-CM | POA: Diagnosis not present

## 2018-11-15 DIAGNOSIS — I6523 Occlusion and stenosis of bilateral carotid arteries: Secondary | ICD-10-CM | POA: Diagnosis not present

## 2018-11-15 DIAGNOSIS — I10 Essential (primary) hypertension: Secondary | ICD-10-CM
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
## 2018-12-07 DIAGNOSIS — Z95818 Presence of other cardiac implants and grafts: Secondary | ICD-10-CM | POA: Diagnosis not present

## 2018-12-07 DIAGNOSIS — I10 Essential (primary) hypertension: Secondary | ICD-10-CM | POA: Diagnosis not present

## 2018-12-07 DIAGNOSIS — I771 Stricture of artery: Secondary | ICD-10-CM | POA: Diagnosis not present

## 2018-12-07 DIAGNOSIS — E782 Mixed hyperlipidemia: Secondary | ICD-10-CM | POA: Diagnosis not present

## 2018-12-07 DIAGNOSIS — I482 Chronic atrial fibrillation, unspecified: Secondary | ICD-10-CM | POA: Diagnosis not present

## 2018-12-07 DIAGNOSIS — I071 Rheumatic tricuspid insufficiency: Secondary | ICD-10-CM | POA: Diagnosis not present

## 2019-02-04 DIAGNOSIS — H401131 Primary open-angle glaucoma, bilateral, mild stage: Secondary | ICD-10-CM | POA: Diagnosis not present

## 2019-02-27 DIAGNOSIS — R739 Hyperglycemia, unspecified: Secondary | ICD-10-CM | POA: Diagnosis not present

## 2019-02-27 DIAGNOSIS — E782 Mixed hyperlipidemia: Secondary | ICD-10-CM | POA: Diagnosis not present

## 2019-02-27 DIAGNOSIS — E538 Deficiency of other specified B group vitamins: Secondary | ICD-10-CM | POA: Diagnosis not present

## 2019-02-27 DIAGNOSIS — C61 Malignant neoplasm of prostate: Secondary | ICD-10-CM | POA: Diagnosis not present

## 2019-02-27 DIAGNOSIS — I482 Chronic atrial fibrillation, unspecified: Secondary | ICD-10-CM | POA: Diagnosis not present

## 2019-02-27 DIAGNOSIS — J310 Chronic rhinitis: Secondary | ICD-10-CM | POA: Diagnosis not present

## 2019-02-27 DIAGNOSIS — I10 Essential (primary) hypertension: Secondary | ICD-10-CM | POA: Diagnosis not present

## 2019-02-27 DIAGNOSIS — R5383 Other fatigue: Secondary | ICD-10-CM | POA: Diagnosis not present

## 2019-02-27 DIAGNOSIS — I7090 Unspecified atherosclerosis: Secondary | ICD-10-CM | POA: Diagnosis not present

## 2019-02-27 DIAGNOSIS — I890 Lymphedema, not elsewhere classified: Secondary | ICD-10-CM | POA: Diagnosis not present

## 2019-02-27 DIAGNOSIS — Z95818 Presence of other cardiac implants and grafts: Secondary | ICD-10-CM | POA: Diagnosis not present

## 2019-02-27 DIAGNOSIS — G458 Other transient cerebral ischemic attacks and related syndromes: Secondary | ICD-10-CM | POA: Diagnosis not present

## 2019-02-27 DIAGNOSIS — R8290 Unspecified abnormal findings in urine: Secondary | ICD-10-CM | POA: Diagnosis not present

## 2019-02-27 DIAGNOSIS — Z6832 Body mass index (BMI) 32.0-32.9, adult: Secondary | ICD-10-CM | POA: Diagnosis not present

## 2019-02-27 DIAGNOSIS — H409 Unspecified glaucoma: Secondary | ICD-10-CM | POA: Diagnosis not present

## 2019-02-27 DIAGNOSIS — Z0000 Encounter for general adult medical examination without abnormal findings: Secondary | ICD-10-CM | POA: Diagnosis not present

## 2019-02-27 DIAGNOSIS — R5381 Other malaise: Secondary | ICD-10-CM | POA: Diagnosis not present

## 2019-02-27 DIAGNOSIS — R6889 Other general symptoms and signs: Secondary | ICD-10-CM | POA: Diagnosis not present

## 2019-03-19 ENCOUNTER — Telehealth: Payer: Self-pay | Admitting: Family Medicine

## 2019-03-19 NOTE — Telephone Encounter (Signed)
Requested medication (s) are due for refill today: yes  Requested medication (s) are on the active medication list: yes  Last refill:  11/25/2018  Future visit scheduled: no  Notes to clinic: LOV-04/25/2018 Last saw Dr. Sanda Klein and has not seen another provider    Requested Prescriptions  Pending Prescriptions Disp Refills   ezetimibe (ZETIA) 10 MG tablet [Pharmacy Med Name: EZETIMIBE 10 MG TABLET] 90 tablet 3    Sig: TAKE 1 TABLET BY MOUTH EVERY DAY      Cardiovascular:  Antilipid - Sterol Transport Inhibitors Failed - 03/19/2019  1:51 AM      Failed - Total Cholesterol in normal range and within 360 days    Cholesterol, Total  Date Value Ref Range Status  08/12/2016 255 (H) 100 - 199 mg/dL Final   Cholesterol  Date Value Ref Range Status  03/30/2017 237 (H) <200 mg/dL Final          Failed - LDL in normal range and within 360 days    LDL Cholesterol (Calc)  Date Value Ref Range Status  03/30/2017 152 (H) mg/dL (calc) Final    Comment:    Reference range: <100 . Desirable range <100 mg/dL for primary prevention;   <70 mg/dL for patients with CHD or diabetic patients  with > or = 2 CHD risk factors. [name] LDL-C is now calculated using the Martin-Hopkins  calculation, which is a validated novel method providing  better accuracy than the Friedewald equation in the  estimation of LDL-C.  Cresenciano Genre et al. Annamaria Helling. [id number]): 2061-2068  (http://education.[employer].com/faq/FAQ164)           Failed - HDL in normal range and within 360 days    HDL  Date Value Ref Range Status  03/30/2017 62 >40 mg/dL Final  08/12/2016 59 >39 mg/dL Final          Failed - Triglycerides in normal range and within 360 days    Triglycerides  Date Value Ref Range Status  03/30/2017 111 <150 mg/dL Final          Passed - Valid encounter within last 12 months    Recent Outpatient Visits           10 months ago Hospital discharge follow-up   [facility] [place],  Satira Anis, MD   1 year ago Cough   [facility], Satira Anis, MD   1 year ago Medicare annual wellness visit, subsequent   [facility], Satira Anis, MD   1 year ago Status post amputation of left thumb   [facility], Satira Anis, MD   2 years ago Benign essential HTN   [place], Satira Anis, MD       Future Appointments             In 1 month [facility]

## 2019-03-19 NOTE — Telephone Encounter (Signed)
Pt states he Is no longer a pt here

## 2019-04-11 ENCOUNTER — Other Ambulatory Visit: Payer: Self-pay | Admitting: Family Medicine

## 2019-04-30 ENCOUNTER — Ambulatory Visit: Payer: Medicare Other

## 2019-05-20 ENCOUNTER — Other Ambulatory Visit: Payer: Self-pay | Admitting: Family Medicine

## 2019-05-27 DIAGNOSIS — C4492 Squamous cell carcinoma of skin, unspecified: Secondary | ICD-10-CM

## 2019-05-27 HISTORY — DX: Squamous cell carcinoma of skin, unspecified: C44.92

## 2019-07-12 DIAGNOSIS — I208 Other forms of angina pectoris: Secondary | ICD-10-CM | POA: Insufficient documentation

## 2019-07-29 ENCOUNTER — Encounter: Payer: Self-pay | Admitting: Dermatology

## 2019-07-29 ENCOUNTER — Other Ambulatory Visit: Payer: Self-pay

## 2019-07-29 ENCOUNTER — Ambulatory Visit (INDEPENDENT_AMBULATORY_CARE_PROVIDER_SITE_OTHER): Payer: Medicare Other | Admitting: Dermatology

## 2019-07-29 DIAGNOSIS — L578 Other skin changes due to chronic exposure to nonionizing radiation: Secondary | ICD-10-CM

## 2019-07-29 DIAGNOSIS — D692 Other nonthrombocytopenic purpura: Secondary | ICD-10-CM

## 2019-07-29 DIAGNOSIS — L570 Actinic keratosis: Secondary | ICD-10-CM | POA: Diagnosis not present

## 2019-07-29 DIAGNOSIS — Z85828 Personal history of other malignant neoplasm of skin: Secondary | ICD-10-CM | POA: Diagnosis not present

## 2019-07-29 DIAGNOSIS — L821 Other seborrheic keratosis: Secondary | ICD-10-CM

## 2019-07-29 DIAGNOSIS — L820 Inflamed seborrheic keratosis: Secondary | ICD-10-CM

## 2019-07-29 NOTE — Progress Notes (Signed)
   Follow-Up Visit   Subjective  Michael Cantrell is a 69 y.o. male who presents for the following: Basal Cell Carcinoma (left temple - biopsy and EDC 05/27/2019) and Follow-up (AK of face and scalp treated with LN2 x 13 05/27/2019).    The following portions of the chart were reviewed this encounter and updated as appropriate:  Tobacco  Allergies  Meds  Problems  Med Hx  Surg Hx  Fam Hx      Review of Systems:  No other skin or systemic complaints except as noted in HPI or Assessment and Plan.  Objective  Well appearing patient in no apparent distress; mood and affect are within normal limits.  A focused examination was performed including face, scalp, ears, arms, hands. Relevant physical exam findings are noted in the Assessment and Plan.  Objective  Face, scalp, ears (27): Erythematous thin papules/macules with gritty scale.   Objective  Left sup sideburn: Erythematous keratotic or waxy stuck-on papule or plaque.    Assessment & Plan    Actinic Damage - diffuse scaly erythematous macules with underlying dyspigmentation - Recommend daily broad spectrum sunscreen SPF 30+ to sun-exposed areas, reapply every 2 hours as needed.  - Call for new or changing lesions.  Seborrheic Keratoses - Stuck-on, waxy, tan-brown papules and plaques  - Discussed benign etiology and prognosis. - Observe - Call for any changes  Purpura - Violaceous macules and patches - Benign - Related to age, sun damage and/or use of blood thinners - Observe - Can use OTC arnica containing moisturizer such as Dermend Bruise Formula if desired - Call for worsening or other concerns     AK (actinic keratosis) (27) Face, scalp, ears  May plan field treatment in the future.  Destruction of lesion - Face, scalp, ears Complexity: simple   Destruction method: cryotherapy   Informed consent: discussed and consent obtained   Timeout:  patient name, date of birth, surgical site, and procedure  verified Lesion destroyed using liquid nitrogen: Yes   Region frozen until ice ball extended beyond lesion: Yes   Outcome: patient tolerated procedure well with no complications   Post-procedure details: wound care instructions given    Inflamed seborrheic keratosis Left sup sideburn  Destruction of lesion - Left sup sideburn Complexity: simple   Destruction method: cryotherapy   Informed consent: discussed and consent obtained   Timeout:  patient name, date of birth, surgical site, and procedure verified Lesion destroyed using liquid nitrogen: Yes   Region frozen until ice ball extended beyond lesion: Yes   Outcome: patient tolerated procedure well with no complications   Post-procedure details: wound care instructions given    Return in about 4 months (around 11/29/2019).   I, Ashok Cordia, CMA, am acting as scribe for Sarina Ser, MD .     Documentation: I have reviewed the above documentation for accuracy and completeness, and I agree with the above.  Sarina Ser, MD

## 2019-09-03 DIAGNOSIS — I340 Nonrheumatic mitral (valve) insufficiency: Secondary | ICD-10-CM | POA: Insufficient documentation

## 2019-09-11 ENCOUNTER — Other Ambulatory Visit: Payer: Self-pay

## 2019-09-11 MED ORDER — EZETIMIBE 10 MG PO TABS: 10.0000 mg | ORAL_TABLET | Freq: Every day | ORAL | 3 refills | Status: AC

## 2019-11-15 ENCOUNTER — Other Ambulatory Visit (HOSPITAL_COMMUNITY): Payer: Self-pay | Admitting: Urology

## 2019-11-15 ENCOUNTER — Other Ambulatory Visit: Payer: Self-pay | Admitting: Urology

## 2019-11-15 DIAGNOSIS — N200 Calculus of kidney: Secondary | ICD-10-CM

## 2019-11-18 ENCOUNTER — Ambulatory Visit (INDEPENDENT_AMBULATORY_CARE_PROVIDER_SITE_OTHER): Payer: Medicare Other

## 2019-11-18 ENCOUNTER — Ambulatory Visit (INDEPENDENT_AMBULATORY_CARE_PROVIDER_SITE_OTHER): Payer: Medicare Other | Admitting: Vascular Surgery

## 2019-11-18 ENCOUNTER — Encounter (INDEPENDENT_AMBULATORY_CARE_PROVIDER_SITE_OTHER): Payer: Self-pay | Admitting: Vascular Surgery

## 2019-11-18 ENCOUNTER — Other Ambulatory Visit: Payer: Self-pay

## 2019-11-18 VITALS — BP 137/81 | HR 75 | Ht 66.0 in | Wt 207.0 lb

## 2019-11-18 DIAGNOSIS — I482 Chronic atrial fibrillation, unspecified: Secondary | ICD-10-CM

## 2019-11-18 DIAGNOSIS — I208 Other forms of angina pectoris: Secondary | ICD-10-CM | POA: Diagnosis not present

## 2019-11-18 DIAGNOSIS — I872 Venous insufficiency (chronic) (peripheral): Secondary | ICD-10-CM

## 2019-11-18 DIAGNOSIS — I10 Essential (primary) hypertension: Secondary | ICD-10-CM

## 2019-11-18 DIAGNOSIS — I6523 Occlusion and stenosis of bilateral carotid arteries: Secondary | ICD-10-CM

## 2019-11-18 DIAGNOSIS — I739 Peripheral vascular disease, unspecified: Secondary | ICD-10-CM

## 2019-11-18 DIAGNOSIS — I771 Stricture of artery: Secondary | ICD-10-CM | POA: Diagnosis not present
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
## 2019-11-25 ENCOUNTER — Encounter (HOSPITAL_COMMUNITY)
Admission: RE | Admit: 2019-11-25 | Discharge: 2019-11-25 | Disposition: A | Discharge status: Home / Self Care | Payer: Medicare Other | Source: Ambulatory Visit | Attending: Urology | Admitting: Urology

## 2019-11-25 ENCOUNTER — Other Ambulatory Visit (HOSPITAL_COMMUNITY): Payer: Self-pay | Admitting: Urology

## 2019-11-25 ENCOUNTER — Other Ambulatory Visit: Payer: Self-pay

## 2019-11-25 DIAGNOSIS — N200 Calculus of kidney: Secondary | ICD-10-CM | POA: Diagnosis not present

## 2019-11-25 MED ORDER — TECHNETIUM TC 99M MERTIATIDE
5.2000 | Freq: Once | INTRAVENOUS | Status: AC | PRN
  Administered 2019-11-25: 5.2 via INTRAVENOUS

## 2019-12-05 ENCOUNTER — Ambulatory Visit: Payer: Medicare Other | Admitting: Dermatology

## 2020-01-15 ENCOUNTER — Emergency Department
Admission: EM | Admit: 2020-01-15 | Discharge: 2020-01-15 | Disposition: A | Discharge status: Home / Self Care | Payer: Medicare Other | Attending: Emergency Medicine | Admitting: Emergency Medicine

## 2020-01-15 ENCOUNTER — Encounter: Payer: Self-pay | Admitting: Emergency Medicine

## 2020-01-15 ENCOUNTER — Other Ambulatory Visit: Payer: Self-pay

## 2020-01-15 DIAGNOSIS — R040 Epistaxis: Secondary | ICD-10-CM | POA: Diagnosis not present

## 2020-01-15 DIAGNOSIS — Z8546 Personal history of malignant neoplasm of prostate: Secondary | ICD-10-CM | POA: Diagnosis not present

## 2020-01-15 DIAGNOSIS — Z7982 Long term (current) use of aspirin: Secondary | ICD-10-CM | POA: Insufficient documentation

## 2020-01-15 DIAGNOSIS — Z79899 Other long term (current) drug therapy: Secondary | ICD-10-CM | POA: Diagnosis not present

## 2020-01-15 DIAGNOSIS — I10 Essential (primary) hypertension: Secondary | ICD-10-CM | POA: Diagnosis not present

## 2020-01-15 DIAGNOSIS — Z85828 Personal history of other malignant neoplasm of skin: Secondary | ICD-10-CM | POA: Diagnosis not present

## 2020-01-15 LAB — COMPREHENSIVE METABOLIC PANEL
ALT: 30 U/L (ref 0–44)
AST: 28 U/L (ref 15–41)
Albumin: 4.1 g/dL (ref 3.5–5.0)
Alkaline Phosphatase: 46 U/L (ref 38–126)
Anion gap: 13 (ref 5–15)
BUN: 19 mg/dL (ref 8–23)
CO2: 19 mmol/L — ABNORMAL LOW (ref 22–32)
Calcium: 9.3 mg/dL (ref 8.9–10.3)
Chloride: 107 mmol/L (ref 98–111)
Creatinine, Ser: 0.8 mg/dL (ref 0.61–1.24)
GFR, Estimated: >60 mL/min (ref >60)
Glucose, Bld: 94 mg/dL (ref 70–99)
Potassium: 4.3 mmol/L (ref 3.5–5.1)
Sodium: 139 mmol/L (ref 135–145)
Total Bilirubin: 1 mg/dL (ref 0.3–1.2)
Total Protein: 7.3 g/dL (ref 6.5–8.1)

## 2020-01-15 LAB — CBC WITH DIFFERENTIAL/PLATELET
Abs Immature Granulocytes: 0.18 10*3/uL — ABNORMAL HIGH (ref 0.00–0.07)
Basophils Absolute: 0.1 10*3/uL (ref 0.0–0.1)
Basophils Relative: 1 %
Eosinophils Absolute: 0.1 10*3/uL (ref 0.0–0.5)
Eosinophils Relative: 1 %
HCT: 46.7 % (ref 39.0–52.0)
Hemoglobin: 15.4 g/dL (ref 13.0–17.0)
Immature Granulocytes: 2 %
Lymphocytes Relative: 21 %
Lymphs Abs: 2.1 10*3/uL (ref 0.7–4.0)
MCH: 33.9 pg (ref 26.0–34.0)
MCHC: 33 g/dL (ref 30.0–36.0)
MCV: 102.9 fL — ABNORMAL HIGH (ref 80.0–100.0)
Monocytes Absolute: 1 10*3/uL (ref 0.1–1.0)
Monocytes Relative: 10 %
Neutro Abs: 6.6 10*3/uL (ref 1.7–7.7)
Neutrophils Relative %: 65 %
Platelets: 190 10*3/uL (ref 150–400)
RBC: 4.54 MIL/uL (ref 4.22–5.81)
RDW: 14.6 % (ref 11.5–15.5)
WBC: 10.1 10*3/uL (ref 4.0–10.5)
nRBC: 0.3 % — ABNORMAL HIGH (ref 0.0–0.2)

## 2020-01-15 LAB — PROTIME-INR
INR: 1 (ref 0.8–1.2)
Prothrombin Time: 12.6 seconds (ref 11.4–15.2)

## 2020-01-15 LAB — APTT: aPTT: 29 seconds (ref 24–36)

## 2020-01-15 MED ORDER — CEPHALEXIN 500 MG PO CAPS: 500.0000 mg | ORAL_CAPSULE | Freq: Two times a day (BID) | ORAL | 0 refills | Status: DC

## 2020-01-15 MED ORDER — LIDOCAINE VISCOUS HCL 2 % MT SOLN
15.0000 mL | Freq: Once | OROMUCOSAL | Status: AC
  Administered 2020-01-15: 15 mL via OROMUCOSAL
  Filled 2020-01-15: qty 15

## 2020-01-15 MED ORDER — OXYMETAZOLINE HCL 0.05 % NA SOLN
1.0000 | Freq: Once | NASAL | Status: AC
  Administered 2020-01-15: 1 via NASAL
  Filled 2020-01-15: qty 30

## 2020-01-15 NOTE — ED Triage Notes (Signed)
Pt presents to ED via POV with c/o nosebleed. Pt states nosebleed started at approx 0930, resolved on it's own, then restarted just prior to arrival. Pt states takes a baby aspirin per day. Clamp applied by first RN upon arrival.
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
## 2020-06-12 DIAGNOSIS — I351 Nonrheumatic aortic (valve) insufficiency: Secondary | ICD-10-CM | POA: Insufficient documentation

## 2020-07-14 DIAGNOSIS — R7309 Other abnormal glucose: Secondary | ICD-10-CM | POA: Insufficient documentation

## 2020-08-29 ENCOUNTER — Other Ambulatory Visit: Payer: Self-pay | Admitting: Family Medicine
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
## 2020-11-16 ENCOUNTER — Other Ambulatory Visit: Payer: Self-pay

## 2020-11-16 ENCOUNTER — Ambulatory Visit (INDEPENDENT_AMBULATORY_CARE_PROVIDER_SITE_OTHER): Payer: Medicare Other

## 2020-11-16 ENCOUNTER — Ambulatory Visit (INDEPENDENT_AMBULATORY_CARE_PROVIDER_SITE_OTHER): Payer: Medicare Other | Admitting: Vascular Surgery

## 2020-11-16 VITALS — BP 147/74 | HR 63 | Ht 71.0 in | Wt 211.0 lb

## 2020-11-16 DIAGNOSIS — I872 Venous insufficiency (chronic) (peripheral): Secondary | ICD-10-CM

## 2020-11-16 DIAGNOSIS — I10 Essential (primary) hypertension: Secondary | ICD-10-CM

## 2020-11-16 DIAGNOSIS — I482 Chronic atrial fibrillation, unspecified: Secondary | ICD-10-CM

## 2020-11-16 DIAGNOSIS — I739 Peripheral vascular disease, unspecified: Secondary | ICD-10-CM

## 2020-11-16 DIAGNOSIS — I6523 Occlusion and stenosis of bilateral carotid arteries: Secondary | ICD-10-CM

## 2020-11-16 DIAGNOSIS — I771 Stricture of artery: Secondary | ICD-10-CM

## 2020-11-18 ENCOUNTER — Encounter (INDEPENDENT_AMBULATORY_CARE_PROVIDER_SITE_OTHER): Payer: Self-pay | Admitting: Vascular Surgery

## 2021-01-07 DIAGNOSIS — R0602 Shortness of breath: Secondary | ICD-10-CM | POA: Insufficient documentation

## 2021-03-10 ENCOUNTER — Other Ambulatory Visit: Payer: Self-pay

## 2021-03-10 ENCOUNTER — Ambulatory Visit (INDEPENDENT_AMBULATORY_CARE_PROVIDER_SITE_OTHER): Payer: Medicare Other | Admitting: Nurse Practitioner

## 2021-03-10 VITALS — BP 147/75 | HR 88 | Ht 67.0 in | Wt 214.0 lb

## 2021-03-10 DIAGNOSIS — E782 Mixed hyperlipidemia: Secondary | ICD-10-CM

## 2021-03-10 DIAGNOSIS — R040 Epistaxis: Secondary | ICD-10-CM | POA: Diagnosis not present

## 2021-03-11 ENCOUNTER — Telehealth (INDEPENDENT_AMBULATORY_CARE_PROVIDER_SITE_OTHER): Payer: Self-pay

## 2021-03-11 NOTE — Telephone Encounter (Signed)
Spoke with the patient and he is scheduled with Dr. Lucky Cowboy for a left maxillary artery embolization on 03/15/21 with a 10:45 am arrival time to the MM. Pre-procedure instructions were discussed and patient stated he was writing this down.

## 2021-03-14 ENCOUNTER — Encounter (INDEPENDENT_AMBULATORY_CARE_PROVIDER_SITE_OTHER): Payer: Self-pay | Admitting: Nurse Practitioner

## 2021-03-14 ENCOUNTER — Other Ambulatory Visit (INDEPENDENT_AMBULATORY_CARE_PROVIDER_SITE_OTHER): Payer: Self-pay | Admitting: Nurse Practitioner

## 2021-03-14 DIAGNOSIS — R040 Epistaxis: Secondary | ICD-10-CM
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
## 2021-03-15 ENCOUNTER — Encounter
Admission: RE | Disposition: A | Discharge status: Home / Self Care | Payer: Self-pay | Source: Home / Self Care | Attending: Vascular Surgery

## 2021-03-15 ENCOUNTER — Other Ambulatory Visit: Payer: Self-pay

## 2021-03-15 ENCOUNTER — Ambulatory Visit
Admission: RE | Admit: 2021-03-15 | Discharge: 2021-03-15 | Disposition: A | Discharge status: Home / Self Care | Payer: Medicare Other | Attending: Vascular Surgery | Admitting: Vascular Surgery

## 2021-03-15 ENCOUNTER — Encounter: Payer: Self-pay | Admitting: Vascular Surgery

## 2021-03-15 DIAGNOSIS — R040 Epistaxis: Secondary | ICD-10-CM | POA: Insufficient documentation

## 2021-03-15 DIAGNOSIS — E782 Mixed hyperlipidemia: Secondary | ICD-10-CM | POA: Insufficient documentation

## 2021-03-15 HISTORY — PX: EMBOLIZATION (CATH LAB): CATH118239

## 2021-03-15 LAB — CREATININE, SERUM
Creatinine, Ser: 1.05 mg/dL (ref 0.61–1.24)
GFR, Estimated: >60 mL/min (ref >60)

## 2021-03-15 LAB — BUN: BUN: 24 mg/dL — ABNORMAL HIGH (ref 8–23)

## 2021-03-15 SURGERY — EMBOLIZATION
Anesthesia: Moderate Sedation | Laterality: Left

## 2021-03-15 MED ORDER — METHYLPREDNISOLONE SODIUM SUCC 125 MG IJ SOLR
INTRAMUSCULAR | Status: AC
  Filled 2021-03-15: qty 2

## 2021-03-15 MED ORDER — MIDAZOLAM HCL 2 MG/2ML IJ SOLN
INTRAMUSCULAR | Status: DC | PRN
  Administered 2021-03-15: 2 mg via INTRAVENOUS

## 2021-03-15 MED ORDER — MIDAZOLAM HCL 2 MG/2ML IJ SOLN
INTRAMUSCULAR | Status: AC
  Filled 2021-03-15: qty 2

## 2021-03-15 MED ORDER — SODIUM CHLORIDE 0.9 % IV SOLN: INTRAVENOUS | Status: DC

## 2021-03-15 MED ORDER — MIDAZOLAM HCL 2 MG/ML PO SYRP: 8.0000 mg | ORAL_SOLUTION | Freq: Once | ORAL | Status: DC | PRN

## 2021-03-15 MED ORDER — DIPHENHYDRAMINE HCL 50 MG/ML IJ SOLN
INTRAMUSCULAR | Status: AC
  Filled 2021-03-15: qty 1

## 2021-03-15 MED ORDER — IODIXANOL 320 MG/ML IV SOLN
INTRAVENOUS | Status: DC | PRN
  Administered 2021-03-15: 13:00:00 45 mL

## 2021-03-15 MED ORDER — FENTANYL CITRATE PF 50 MCG/ML IJ SOSY
PREFILLED_SYRINGE | INTRAMUSCULAR | Status: AC
  Filled 2021-03-15: qty 1

## 2021-03-15 MED ORDER — FAMOTIDINE 20 MG PO TABS
ORAL_TABLET | ORAL | Status: AC
  Filled 2021-03-15: qty 2

## 2021-03-15 MED ORDER — METHYLPREDNISOLONE SODIUM SUCC 125 MG IJ SOLR
125.0000 mg | Freq: Once | INTRAMUSCULAR | Status: AC | PRN
  Administered 2021-03-15: 13:00:00 125 mg via INTRAVENOUS

## 2021-03-15 MED ORDER — ACETAMINOPHEN 325 MG PO TABS: 650.0000 mg | ORAL_TABLET | Freq: Four times a day (QID) | ORAL | Status: DC | PRN

## 2021-03-15 MED ORDER — FAMOTIDINE 20 MG PO TABS
40.0000 mg | ORAL_TABLET | Freq: Once | ORAL | Status: AC | PRN
  Administered 2021-03-15: 13:00:00 40 mg via ORAL

## 2021-03-15 MED ORDER — ONDANSETRON HCL 4 MG/2ML IJ SOLN: 4.0000 mg | Freq: Four times a day (QID) | INTRAMUSCULAR | Status: DC | PRN

## 2021-03-15 MED ORDER — HYDROMORPHONE HCL 1 MG/ML IJ SOLN: 1.0000 mg | Freq: Once | INTRAMUSCULAR | Status: DC | PRN

## 2021-03-15 MED ORDER — DIPHENHYDRAMINE HCL 50 MG/ML IJ SOLN
50.0000 mg | Freq: Once | INTRAMUSCULAR | Status: AC | PRN
  Administered 2021-03-15: 13:00:00 50 mg via INTRAVENOUS

## 2021-03-15 MED ORDER — FENTANYL CITRATE (PF) 100 MCG/2ML IJ SOLN
INTRAMUSCULAR | Status: DC | PRN
  Administered 2021-03-15: 50 ug via INTRAVENOUS

## 2021-03-15 MED ORDER — CEFAZOLIN SODIUM-DEXTROSE 2-4 GM/100ML-% IV SOLN
2.0000 g | Freq: Once | INTRAVENOUS | Status: AC
  Administered 2021-03-15: 13:00:00 2 g via INTRAVENOUS

## 2021-03-15 MED ORDER — HEPARIN SODIUM (PORCINE) 1000 UNIT/ML IJ SOLN
INTRAMUSCULAR | Status: AC
  Filled 2021-03-15: qty 10

## 2021-03-15 MED ORDER — ACETAMINOPHEN 325 MG PO TABS
ORAL_TABLET | ORAL | Status: AC
  Administered 2021-03-15: 16:00:00 650 mg via ORAL
  Filled 2021-03-15: qty 2

## 2021-03-15 SURGICAL SUPPLY — 18 items
CATH ANGIO 5F PIGTAIL 100CM (CATHETERS) ×2 IMPLANT
CATH BEACON 5 .035 100 H1 TIP (CATHETERS) ×2 IMPLANT
CATH MICROCATH PRGRT 2.8F 130 (MICROCATHETER) IMPLANT
COIL 400 COMPLEX SOFT 2X4CM (Vascular Products) ×2 IMPLANT
COIL 400 COMPLEX SOFT 3X5CM (Vascular Products) ×4 IMPLANT
COVER PROBE U/S 5X48 (MISCELLANEOUS) ×2 IMPLANT
DEVICE OCCLUSION PODJ5 (Embolic) IMPLANT
DEVICE STARCLOSE SE CLOSURE (Vascular Products) ×2 IMPLANT
GAUZE SPONGE 4X4 12PLY STRL (GAUZE/BANDAGES/DRESSINGS) ×4 IMPLANT
GLIDEWIRE ANGLED SS 035X260CM (WIRE) ×2 IMPLANT
HANDLE DETACHMENT COIL (MISCELLANEOUS) ×2 IMPLANT
MICROCATH PROGREAT 2.8F 130CM (MICROCATHETER) ×3
OCCLUSION DEVICE PODJ5 (Embolic) ×3 IMPLANT
PACK ANGIOGRAPHY (CUSTOM PROCEDURE TRAY) ×3 IMPLANT
SHEATH PINNACLE 5F 10CM (SHEATH) ×2 IMPLANT
SYR MEDRAD MARK 7 150ML (SYRINGE) ×2 IMPLANT
TUBING CONTRAST HIGH PRESS 72 (TUBING) ×2 IMPLANT
WIRE GUIDERIGHT .035X150 (WIRE) ×2 IMPLANT

## 2021-03-15 NOTE — Interval H&P Note (Signed)
History and Physical Interval Note:  03/15/2021 12:53 PM  Michael Cantrell  has presented today for surgery, with the diagnosis of LT maxillary artery embolization   Persistent nose bleeds.  The various methods of treatment have been discussed with the patient and family. After consideration of risks, benefits and other options for treatment, the patient has consented to  Procedure(s): EMBOLIZATION (Left) as a surgical intervention.  The patient's history has been reviewed, patient examined, no change in status, stable for surgery.  I have reviewed the patient's chart and labs.  Questions were answered to the patient's satisfaction.     [name]

## 2021-03-15 NOTE — Op Note (Signed)
[facility]  Percutaneous Study/Intervention Procedural Note     Surgeon(s): [employer]   Assistants: none  Pre-operative Diagnosis: 1. Intractable nosebleeds  Post-operative diagnosis:  Same  Procedure(s) Performed:             1.  Ultrasound guidance for vascular access right femoral artery             2.  Catheter placement into left internal maxillary artery from right femoral approach             3.  Thoracic aortogram and selective angiogram of the external carotid artery and then the maxillary branch of the left external carotid artery             4.  Coil embolization of the 2 primary branches of the maxillary artery feeding the nose with a total of 4 Ruby coils             5.  StarClose closure device right femoral artery  Anesthesia: Moderate conscious sedation for approximately 24 minutes using 2 mg of Versed and 50 mcg of Fentanyl              EBL: 5 cc  Fluoro Time: 3.5 minutes  Contrast: 45 cc              Indications:  Patient is a 70 y.o.male with intractable nosebleeds from the left nostril.  This is requiring packing and has had multiple doctor visits including recent visit to ENT who consulted [place] for further evaluation and treatment.  Embolization was discussed as an option for stopping bleeding and he desired to proceed.  Risks and benefits were discussed with the patient  Procedure:  The patient was identified and appropriate procedural time out was performed.  The patient was then placed supine on the table and prepped and draped in the usual sterile fashion. Moderate conscious sedation was administered during a face to face encounter with the patient throughout the procedure with my supervision of the RN administering medicines and monitoring the patient's vital signs, pulse oximetry, telemetry and mental status throughout from the start of the procedure until the patient was taken to the recovery room.  Ultrasound was used to evaluate the  right common femoral artery.  It was patent .  A digital ultrasound image was acquired.  A Seldinger needle was used to access the right common femoral artery under direct ultrasound guidance and a permanent image was performed.  A 0.035 J wire was advanced without resistance and a 5Fr sheath was placed.  Pigtail catheter was placed into the ascending aorta and a LAO projection aortogram was performed.  This showed typical origins of the great vessels.  The left subclavian artery did appear to have at least a moderate stenosis just beyond its origin.  The left carotid artery was widely patent and was easily cannulated with a headhunter catheter and a Glidewire.  Imaging showed the common carotid artery to have no significant disease with a typical carotid bifurcation.  I then advanced the headhunter catheter and the Glidewire up into the proximal left external carotid artery where selective imaging was performed.  This was a reasonably healthy vessel and the maxillary branch of the external carotid artery was easily identified.  This was then cannulated with a prograde microcatheter was advanced out into the mid to distal maxillary artery.  Selective imaging showed significant flow in this vessel to the nostril although there was not active bleeding as he had  a pack in place.  There were 2 primary branches feeding this area.  The first was small and was cannulated initially and coil embolized with a single 2 mm diameter by 4 cm length coil.  I then pulled back into the larger main maxillary artery branch.  A pair of 3 mm diameter by 6 cm length standard coils were deployed initially followed by 15 cm packing coil with complete embolization of the maxillary artery and essentially no blood flow going to the nostril on the left side at this point.  I elected to terminate the procedure. The diagnostic catheter was removed. StarClose closure device was deployed in usual fashion with excellent hemostatic result. The  patient was taken to the recovery room in stable condition having tolerated the procedure well.     Findings:  none  Disposition: Patient was taken to the recovery room in stable condition having tolerated the procedure well.  Complications:  None  [name] 03/15/2021 1:48 PM   This note was created with Dragon Medical transcription system. Any errors in dictation are purely unintentional.

## 2021-03-15 NOTE — Interval H&P Note (Signed)
History and Physical Interval Note:  03/15/2021 11:52 AM  Michael Cantrell  has presented today for surgery, with the diagnosis of LT maxillary artery embolization   Persistent nose bleeds.  The various methods of treatment have been discussed with the patient and family. After consideration of risks, benefits and other options for treatment, the patient has consented to  Procedure(s): EMBOLIZATION (Left) as a surgical intervention.  The patient's history has been reviewed, patient examined, no change in status, stable for surgery.  I have reviewed the patient's chart and labs.  Questions were answered to the patient's satisfaction.     [name]

## 2021-03-16 ENCOUNTER — Encounter: Payer: Self-pay | Admitting: Vascular Surgery

## 2021-03-17 ENCOUNTER — Encounter (INDEPENDENT_AMBULATORY_CARE_PROVIDER_SITE_OTHER): Payer: Medicare Other

## 2021-03-23 ENCOUNTER — Encounter: Payer: Self-pay | Admitting: Vascular Surgery

## 2021-03-24 ENCOUNTER — Encounter (INDEPENDENT_AMBULATORY_CARE_PROVIDER_SITE_OTHER): Payer: PRIVATE HEALTH INSURANCE

## 2021-03-31 ENCOUNTER — Encounter (INDEPENDENT_AMBULATORY_CARE_PROVIDER_SITE_OTHER): Payer: PRIVATE HEALTH INSURANCE

## 2021-04-01 ENCOUNTER — Encounter (INDEPENDENT_AMBULATORY_CARE_PROVIDER_SITE_OTHER): Payer: Medicare Other | Admitting: Nurse Practitioner

## 2021-04-02 ENCOUNTER — Ambulatory Visit (INDEPENDENT_AMBULATORY_CARE_PROVIDER_SITE_OTHER): Payer: Medicare Other | Admitting: Vascular Surgery

## 2021-04-02 ENCOUNTER — Other Ambulatory Visit: Payer: Self-pay

## 2021-04-02 ENCOUNTER — Encounter (INDEPENDENT_AMBULATORY_CARE_PROVIDER_SITE_OTHER): Payer: Self-pay | Admitting: Vascular Surgery

## 2021-04-02 VITALS — BP 169/76 | HR 77 | Resp 16 | Wt 216.0 lb

## 2021-04-02 DIAGNOSIS — E782 Mixed hyperlipidemia: Secondary | ICD-10-CM

## 2021-04-02 DIAGNOSIS — R040 Epistaxis: Secondary | ICD-10-CM | POA: Diagnosis not present

## 2021-04-02 DIAGNOSIS — I10 Essential (primary) hypertension: Secondary | ICD-10-CM

## 2021-04-02 NOTE — Assessment & Plan Note (Signed)
lipid control important in reducing the progression of atherosclerotic disease. Continue statin therapy  
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
## 2021-04-02 NOTE — Assessment & Plan Note (Signed)
The patient is doing well after maxillary artery embolization for intractable nosebleeds on the left.  He has had only 1 very small nosebleed since that time and this was not substantial.  At this point, I will see him back as needed.

## 2021-04-02 NOTE — Assessment & Plan Note (Signed)
blood pressure control important in reducing the progression of atherosclerotic disease. On appropriate oral medications.  

## 2021-04-07 ENCOUNTER — Ambulatory Visit (INDEPENDENT_AMBULATORY_CARE_PROVIDER_SITE_OTHER): Payer: Medicare Other | Admitting: Nurse Practitioner

## 2021-04-12 ENCOUNTER — Ambulatory Visit (INDEPENDENT_AMBULATORY_CARE_PROVIDER_SITE_OTHER): Payer: Medicare Other | Admitting: Dermatology

## 2021-04-12 ENCOUNTER — Encounter: Payer: Self-pay | Admitting: Dermatology

## 2021-04-12 ENCOUNTER — Other Ambulatory Visit: Payer: Self-pay

## 2021-04-12 DIAGNOSIS — L578 Other skin changes due to chronic exposure to nonionizing radiation: Secondary | ICD-10-CM | POA: Diagnosis not present

## 2021-04-12 DIAGNOSIS — L570 Actinic keratosis: Secondary | ICD-10-CM

## 2021-04-12 DIAGNOSIS — L299 Pruritus, unspecified: Secondary | ICD-10-CM

## 2021-04-12 DIAGNOSIS — L2089 Other atopic dermatitis: Secondary | ICD-10-CM | POA: Diagnosis not present

## 2021-04-12 DIAGNOSIS — C4442 Squamous cell carcinoma of skin of scalp and neck: Secondary | ICD-10-CM | POA: Diagnosis not present

## 2021-04-12 DIAGNOSIS — D485 Neoplasm of uncertain behavior of skin: Secondary | ICD-10-CM | POA: Diagnosis not present

## 2021-04-12 DIAGNOSIS — D492 Neoplasm of unspecified behavior of bone, soft tissue, and skin: Secondary | ICD-10-CM

## 2021-04-12 HISTORY — DX: Actinic keratosis: L57.0

## 2021-04-12 MED ORDER — MOMETASONE FUROATE 0.1 % EX CREA: TOPICAL_CREAM | CUTANEOUS | 2 refills | Status: AC

## 2021-04-12 NOTE — Patient Instructions (Addendum)
Gentle Skin Care Guide  1. Bathe no more than once a day.  2. Avoid bathing in hot water  3. Use a mild soap like Dove, Vanicream, Cetaphil, CeraVe. Can use Lever 2000 or Cetaphil antibacterial soap  4. Use soap only where you need it. On most days, use it under your arms, between your legs, and on your feet. Let the water rinse other areas unless visibly dirty.  5. When you get out of the bath/shower, use a towel to gently blot your skin dry, don't rub it.  6. While your skin is still a little damp, apply a moisturizing cream such as Vanicream, CeraVe, Cetaphil, Eucerin, Sarna lotion or plain Vaseline Jelly. For hands apply Neutrogena [place] Hand Cream or Excipial Hand Cream.  7. Reapply moisturizer any time you start to itch or feel dry.  8. Sometimes using free and clear laundry detergents can be helpful. Fabric softener sheets should be avoided. Downy Free & Gentle liquid, or any liquid fabric softener that is free of dyes and perfumes, it acceptable to use  9. If your doctor has given you prescription creams you may apply moisturizers over them   Cryotherapy Aftercare  Wash gently with soap and water everyday.   Apply Vaseline and Band-Aid daily until healed.   Prior to procedure, discussed risks of blister formation, small wound, skin dyspigmentation, or rare scar following cryotherapy. Recommend Vaseline ointment to treated areas while healing.    Wound Care Instructions  Cleanse wound gently with soap and water once a day then pat dry with clean gauze. Apply a thing coat of Petrolatum (petroleum jelly, "Vaseline") over the wound (unless you have an allergy to this). We recommend that you use a new, sterile tube of Vaseline. Do not pick or remove scabs. Do not remove the yellow or white "healing tissue" from the base of the wound.  Cover the wound with fresh, clean, nonstick gauze and secure with paper tape. You may use Band-Aids in place of gauze and tape if the would is  small enough, but would recommend trimming much of the tape off as there is often too much. Sometimes Band-Aids can irritate the skin.  You should call the office for your biopsy report after 1 week if you have not already been contacted.  If you experience any problems, such as abnormal amounts of bleeding, swelling, significant bruising, significant pain, or evidence of infection, please call the office immediately.  FOR ADULT SURGERY PATIENTS: If you need something for pain relief you may take 1 extra strength Tylenol (acetaminophen) AND 2 Ibuprofen (200mg  each) together every 4 hours as needed for pain. (do not take these if you are allergic to them or if you have a reason you should not take them.) Typically, you may only need pain medication for 1 to 3 days.   If You Need Anything After Your Visit  If you have any questions or concerns for your doctor, please call our main line at [phone] and press option 4 to reach your doctor's medical assistant. If no one answers, please leave a voicemail as directed and we will return your call as soon as possible. Messages left after 4 pm will be answered the following business day.   You may also send [place] a message via [place]. We typically respond to MyChart messages within 1-2 business days.  For prescription refills, please ask your pharmacy to contact our office. Our fax number is [phone].  If you have an urgent issue when the  clinic is closed that cannot wait until the next business day, you can page your doctor at the number below.    Please note that while we do our best to be available for urgent issues outside of office hours, we are not available 24/7.   If you have an urgent issue and are unable to reach [place], you may choose to seek medical care at your doctor's office, retail clinic, urgent care center, or emergency room.  If you have a medical emergency, please immediately call 911 or go to the emergency department.  Pager  Numbers  - Dr. Nehemiah Massed: [phone]  - Dr. Laurence Ferrari: [phone]  - Dr. Nicole Kindred: [phone]  In the event of inclement weather, please call our main line at [phone] for an update on the status of any delays or closures.  Dermatology Medication Tips: Please keep the boxes that topical medications come in in order to help keep track of the instructions about where and how to use these. Pharmacies typically print the medication instructions only on the boxes and not directly on the medication tubes.   If your medication is too expensive, please contact our office at [phone] option 4 or send [place] a message through [place].   We are unable to tell what your co-pay for medications will be in advance as this is different depending on your insurance coverage. However, we may be able to find a substitute medication at lower cost or fill out paperwork to get insurance to cover a needed medication.   If a prior authorization is required to get your medication covered by your insurance company, please allow [place] 1-2 business days to complete this process.  Drug prices often vary depending on where the prescription is filled and some pharmacies may offer cheaper prices.  The website www.goodrx.com contains coupons for medications through different pharmacies. The prices here do not account for what the cost may be with help from insurance (it may be cheaper with your insurance), but the website can give you the price if you did not use any insurance.  - You can print the associated coupon and take it with your prescription to the pharmacy.  - You may also stop by our office during regular business hours and pick up a GoodRx coupon card.  - If you need your prescription sent electronically to a different pharmacy, notify our office through [facility] or by phone at [phone] option 4.     Si Usted Necesita Algo Despus de Su Visita  Tambin puede enviarnos un mensaje a travs de  Pharmacist, community. Por lo general respondemos a los mensajes de MyChart en el transcurso de 1 a 2 das hbiles.  Para renovar recetas, por favor pida a su farmacia que se ponga en contacto con nuestra oficina. Harland Dingwall de fax es [place] [phone].  Si tiene un asunto urgente cuando la clnica est cerrada y que no puede esperar hasta el siguiente da hbil, puede llamar/localizar a su doctor(a) al nmero que aparece a continuacin.   Por favor, tenga en cuenta que aunque hacemos todo lo posible para estar disponibles para asuntos urgentes fuera del horario de [place], no estamos disponibles las 24 horas del da, los 7 das de la [place].   Si tiene un problema urgente y no puede comunicarse con nosotros, puede optar por buscar atencin mdica  en el consultorio de su doctor(a), en una clnica privada, en un centro de atencin urgente o en una sala de emergencias.  Si tiene [employer]  emergencia mdica, por favor llame inmediatamente al 911 o vaya a la sala de emergencias.  Nmeros de bper  - Dr. Nehemiah Massed: [phone]  - Dra. Moye: [phone]  - Dra. Nicole Kindred: [phone]  En caso de inclemencias del [place], por favor llame a Johnsie Kindred principal al [phone] para una actualizacin sobre el [place] de cualquier retraso o cierre.  Consejos para la medicacin en dermatologa: Por favor, guarde las cajas en las que vienen los medicamentos de uso tpico para ayudarle a seguir las instrucciones sobre dnde y cmo usarlos. Las farmacias generalmente imprimen las instrucciones del medicamento slo en las cajas y no directamente en los tubos del [place].   Si su medicamento es muy caro, por favor, pngase en contacto con Zigmund Daniel llamando al [phone] y presione la opcin 4 o envenos un mensaje a travs de Pharmacist, community.   No podemos decirle cul ser su copago por los medicamentos por adelantado ya que esto es diferente dependiendo de la cobertura de su seguro. Sin embargo, es posible que  podamos encontrar un medicamento sustituto a Electrical engineer un formulario para que el seguro cubra el medicamento que se considera necesario.   Si se requiere una autorizacin previa para que su compaa de seguros [place] su medicamento, por favor permtanos de 1 a 2 das hbiles para completar este proceso.  Los precios de los medicamentos varan con frecuencia dependiendo del Environmental consultant de dnde se surte la receta y alguna farmacias pueden ofrecer precios ms baratos.  El sitio web www.goodrx.com tiene cupones para medicamentos de Airline pilot. Los precios aqu no tienen en cuenta lo que podra costar con la ayuda del seguro (puede ser ms barato con su seguro), pero el sitio web puede darle el precio si no utiliz Research scientist (physical sciences).  - Puede imprimir el cupn correspondiente y llevarlo con su receta a la farmacia.  - Tambin puede pasar por nuestra oficina durante el horario de atencin regular y Charity fundraiser una tarjeta de cupones de GoodRx.  - Si necesita que su receta se enve electrnicamente a una farmacia diferente, informe a nuestra oficina a travs de MyChart de [place] o por telfono llamando al [phone] y presione la opcin 4.

## 2021-04-12 NOTE — Progress Notes (Signed)
Follow-Up Visit   Subjective  Michael Cantrell is a 71 y.o. male who presents for the following: lesions (Check lesions on neck, left shoulder, scalp, posterior legs. Raised, rough, itching, bothersome.). The patient has spots, moles and lesions to be evaluated, some may be new or changing and the patient has concerns that these could be cancer.  Wife with patient.   The following portions of the chart were reviewed this encounter and updated as appropriate:  Tobacco   Allergies   Meds   Problems   Med Hx   Surg Hx   Fam Hx      Review of Systems: No other skin or systemic complaints except as noted in HPI or Assessment and Plan.  Objective  Well appearing patient in no apparent distress; mood and affect are within normal limits.  A focused examination was performed including scalp, face, neck, shoulders, lower legs. Relevant physical exam findings are noted in the Assessment and Plan.  right scalp 1.1 cm Cutaneous horn     Right Lateral Neck 1.1 cm Cutaneous horn     Right Antecubital Fossa, back Scaly erythematous papules and patches +/- dyspigmentation, lichenification, excoriations.   Scalp x20. right ear x1 (21) Erythematous thin papules/macules with gritty scale.    Assessment & Plan  Neoplasm of skin (2) right scalp Epidermal / dermal shaving  Lesion diameter (cm):  1.1 Informed consent: discussed and consent obtained   Timeout: patient name, date of birth, surgical site, and procedure verified   Procedure prep:  Patient was prepped and draped in usual sterile fashion Prep type:  Isopropyl alcohol Anesthesia: the lesion was anesthetized in a standard fashion   Anesthetic:  1% lidocaine w/ epinephrine 1-100,000 buffered w/ 8.4% NaHCO3 Instrument used: flexible razor blade   Hemostasis achieved with: pressure, aluminum chloride and electrodesiccation   Outcome: patient tolerated procedure well   Post-procedure details: sterile dressing applied and wound care  instructions given   Dressing type: bandage and petrolatum    Destruction of lesion Complexity: extensive   Destruction method: electrodesiccation and curettage   Informed consent: discussed and consent obtained   Timeout:  patient name, date of birth, surgical site, and procedure verified Procedure prep:  Patient was prepped and draped in usual sterile fashion Prep type:  Isopropyl alcohol Anesthesia: the lesion was anesthetized in a standard fashion   Anesthetic:  1% lidocaine w/ epinephrine 1-100,000 buffered w/ 8.4% NaHCO3 Curettage performed in three different directions: Yes   Electrodesiccation performed over the curetted area: Yes   Curettage cycles:  3 Lesion length (cm):  1.1 Lesion width (cm):  1.1 Margin per side (cm):  0.2 Final wound size (cm):  1.5 Hemostasis achieved with:  pressure and aluminum chloride Outcome: patient tolerated procedure well with no complications   Post-procedure details: sterile dressing applied and wound care instructions given   Dressing type: bandage and petrolatum    Specimen 1 - Surgical pathology Differential Diagnosis: R/O SCC Check Margins: No  Right Lateral Neck Epidermal / dermal shaving  Lesion diameter (cm):  1.1 Informed consent: discussed and consent obtained   Timeout: patient name, date of birth, surgical site, and procedure verified   Procedure prep:  Patient was prepped and draped in usual sterile fashion Prep type:  Isopropyl alcohol Anesthesia: the lesion was anesthetized in a standard fashion   Anesthetic:  1% lidocaine w/ epinephrine 1-100,000 buffered w/ 8.4% NaHCO3 Instrument used: flexible razor blade   Hemostasis achieved with: pressure, aluminum chloride and electrodesiccation  Outcome: patient tolerated procedure well   Post-procedure details: sterile dressing applied and wound care instructions given   Dressing type: bandage and petrolatum    Destruction of lesion Complexity: extensive   Destruction  method: electrodesiccation and curettage   Informed consent: discussed and consent obtained   Timeout:  patient name, date of birth, surgical site, and procedure verified Procedure prep:  Patient was prepped and draped in usual sterile fashion Prep type:  Isopropyl alcohol Anesthesia: the lesion was anesthetized in a standard fashion   Anesthetic:  1% lidocaine w/ epinephrine 1-100,000 buffered w/ 8.4% NaHCO3 Curettage performed in three different directions: Yes   Electrodesiccation performed over the curetted area: Yes   Curettage cycles:  3 Lesion length (cm):  1.1 Lesion width (cm):  1.1 Margin per side (cm):  0.2 Final wound size (cm):  1.5 Hemostasis achieved with:  pressure and aluminum chloride Outcome: patient tolerated procedure well with no complications   Post-procedure details: sterile dressing applied and wound care instructions given   Dressing type: bandage and petrolatum    Specimen 2 - Surgical pathology Differential Diagnosis: R/O SCC Check Margins: No  Flexural atopic dermatitis Right Antecubital Fossa, back With pruritis  Atopic dermatitis (eczema) is a chronic, relapsing, pruritic condition that can significantly affect quality of life. It is often associated with allergic rhinitis and/or asthma and can require treatment with topical medications, phototherapy, or in severe cases biologic injectable medication (Dupixent; Adbry) or Oral JAK inhibitors.  Start Mometasone cream BID PRN  Topical steroids (such as triamcinolone, fluocinolone, fluocinonide, mometasone, clobetasol, halobetasol, betamethasone, hydrocortisone) can cause thinning and lightening of the skin if they are used for too long in the same area. Your physician has selected the right strength medicine for your problem and area affected on the body. Please use your medication only as directed by your physician to prevent side effects.    mometasone (ELOCON) 0.1 % cream - Right Antecubital Fossa,  back Apply twice daily to affected areas on body PRN only. Avoid face, groin, underarms  AK (actinic keratosis) (21) Scalp x20. right ear x1 Actinic keratoses are precancerous spots that appear secondary to cumulative UV radiation exposure/sun exposure over time. They are chronic with expected duration over 1 year. A portion of actinic keratoses will progress to squamous cell carcinoma of the skin. It is not possible to reliably predict which spots will progress to skin cancer and so treatment is recommended to prevent development of skin cancer.  Recommend daily broad spectrum sunscreen SPF 30+ to sun-exposed areas, reapply every 2 hours as needed.  Recommend staying in the shade or wearing long sleeves, sun glasses (UVA+UVB protection) and wide brim hats (4-inch brim around the entire circumference of the hat). Call for new or changing lesions.  Destruction of lesion - Scalp x20. right ear x1 Complexity: simple   Destruction method: cryotherapy   Informed consent: discussed and consent obtained   Timeout:  patient name, date of birth, surgical site, and procedure verified Lesion destroyed using liquid nitrogen: Yes   Region frozen until ice ball extended beyond lesion: Yes   Outcome: patient tolerated procedure well with no complications   Post-procedure details: wound care instructions given    Actinic Damage - chronic, secondary to cumulative UV radiation exposure/sun exposure over time - diffuse scaly erythematous macules with underlying dyspigmentation - Recommend daily broad spectrum sunscreen SPF 30+ to sun-exposed areas, reapply every 2 hours as needed.  - Recommend staying in the shade or wearing long sleeves, sun  glasses (UVA+UVB protection) and wide brim hats (4-inch brim around the entire circumference of the hat). - Call for new or changing lesions.  Return in about 3 months (around 07/11/2021) for AK Follow Up.  I, Emelia Salisbury, CMA, am acting as scribe for Sarina Ser,  MD. Documentation: I have reviewed the above documentation for accuracy and completeness, and I agree with the above.  Sarina Ser, MD

## 2021-04-13 ENCOUNTER — Encounter: Payer: Self-pay | Admitting: Dermatology

## 2021-04-15 ENCOUNTER — Other Ambulatory Visit: Payer: Self-pay

## 2021-04-15 ENCOUNTER — Telehealth: Payer: Self-pay

## 2021-04-15 ENCOUNTER — Other Ambulatory Visit: Payer: Medicare Other

## 2021-04-15 ENCOUNTER — Other Ambulatory Visit: Payer: Self-pay | Admitting: *Deleted

## 2021-04-15 DIAGNOSIS — C61 Malignant neoplasm of prostate: Secondary | ICD-10-CM

## 2021-04-15 NOTE — Telephone Encounter (Signed)
-----   Message from Ralene Bathe, MD sent at 04/14/2021  6:04 PM EST ----- Diagnosis 1. Skin , right scalp WELL DIFFERENTIATED SQUAMOUS CELL CARCINOMA, BASE INVOLVED 2. Skin , right lateral neck ACTINIC KERATOSIS, PROBABLE EARLY EVOLVING LESION OVERLYING NODULAR SOLAR ELASTOSIS  1- Cancer - SCC Already treated Recheck next visit 2- PreCancer  Already treated Recheck next visit

## 2021-04-15 NOTE — Telephone Encounter (Signed)
Patient informed of pathology results 

## 2021-04-16 LAB — PSA: Prostate Specific Ag, Serum: 0.2 ng/mL (ref 0.0–4.0)

## 2021-04-22 ENCOUNTER — Ambulatory Visit: Payer: Medicare Other | Admitting: Urology

## 2021-04-28 ENCOUNTER — Encounter: Payer: Self-pay | Admitting: Urology

## 2021-04-28 ENCOUNTER — Ambulatory Visit (INDEPENDENT_AMBULATORY_CARE_PROVIDER_SITE_OTHER): Payer: Medicare Other | Admitting: Urology

## 2021-04-28 ENCOUNTER — Other Ambulatory Visit: Payer: Self-pay

## 2021-04-28 VITALS — BP 153/81 | HR 68 | Ht 67.0 in | Wt 212.0 lb

## 2021-04-28 DIAGNOSIS — N393 Stress incontinence (female) (male): Secondary | ICD-10-CM

## 2021-04-28 DIAGNOSIS — N261 Atrophy of kidney (terminal): Secondary | ICD-10-CM | POA: Diagnosis not present

## 2021-04-28 DIAGNOSIS — C61 Malignant neoplasm of prostate: Secondary | ICD-10-CM | POA: Diagnosis not present

## 2021-04-28 MED ORDER — BICALUTAMIDE 50 MG PO TABS: 50.0000 mg | ORAL_TABLET | Freq: Every day | ORAL | 3 refills | Status: DC

## 2021-04-28 MED ORDER — FINASTERIDE 5 MG PO TABS: 5.0000 mg | ORAL_TABLET | Freq: Every day | ORAL | 3 refills | Status: DC

## 2021-04-28 NOTE — Patient Instructions (Signed)
You can order a penile clamp called a Cunningham clamp or Wiesner clamp from [email or web address] which can help with urinary leakage.  This is worn during the day when you are physically active, and removed when you feel the urge to urinate.  Most patients will not wear it overnight when they are not leaking.
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
## 2021-07-14 ENCOUNTER — Ambulatory Visit (INDEPENDENT_AMBULATORY_CARE_PROVIDER_SITE_OTHER): Payer: Medicare Other | Admitting: Dermatology

## 2021-07-14 DIAGNOSIS — Z872 Personal history of diseases of the skin and subcutaneous tissue: Secondary | ICD-10-CM

## 2021-07-14 DIAGNOSIS — L570 Actinic keratosis: Secondary | ICD-10-CM | POA: Diagnosis not present

## 2021-07-14 DIAGNOSIS — D492 Neoplasm of unspecified behavior of bone, soft tissue, and skin: Secondary | ICD-10-CM | POA: Diagnosis not present

## 2021-07-14 DIAGNOSIS — Z85828 Personal history of other malignant neoplasm of skin: Secondary | ICD-10-CM | POA: Diagnosis not present

## 2021-07-14 DIAGNOSIS — L820 Inflamed seborrheic keratosis: Secondary | ICD-10-CM | POA: Diagnosis not present

## 2021-07-14 DIAGNOSIS — L720 Epidermal cyst: Secondary | ICD-10-CM | POA: Diagnosis not present

## 2021-07-14 DIAGNOSIS — D692 Other nonthrombocytopenic purpura: Secondary | ICD-10-CM

## 2021-07-14 DIAGNOSIS — L578 Other skin changes due to chronic exposure to nonionizing radiation: Secondary | ICD-10-CM

## 2021-07-14 NOTE — Progress Notes (Addendum)
Follow-Up Visit   Subjective  Michael Cantrell is a 71 y.o. male who presents for the following: Actinic Keratosis. The patient has spots, moles and lesions to be evaluated, some may be new or changing and the patient has concerns that these could be cancer.  The following portions of the chart were reviewed this encounter and updated as appropriate:   Tobacco  Allergies  Meds  Problems  Med Hx  Surg Hx  Fam Hx     Review of Systems:  No other skin or systemic complaints except as noted in HPI or Assessment and Plan.  Objective  Well appearing patient in no apparent distress; mood and affect are within normal limits.  A focused examination was performed including face,scalp,arms. Relevant physical exam findings are noted in the Assessment and Plan.  Scalp, face, ears x 20 (20) Erythematous thin papules/macules with gritty scale.   scalp, face x 5 (5) Stuck-on, waxy, tan-brown papules  left med canthus Subcutaneous nodule.   left inferior cheek 1.2 cm crusted ulcer         Assessment & Plan  AK (actinic keratosis) (20) Scalp, face, ears x 20 Actinic keratoses are precancerous spots that appear secondary to cumulative UV radiation exposure/sun exposure over time. They are chronic with expected duration over 1 year. A portion of actinic keratoses will progress to squamous cell carcinoma of the skin. It is not possible to reliably predict which spots will progress to skin cancer and so treatment is recommended to prevent development of skin cancer.  Recommend daily broad spectrum sunscreen SPF 30+ to sun-exposed areas, reapply every 2 hours as needed.  Recommend staying in the shade or wearing long sleeves, sun glasses (UVA+UVB protection) and wide brim hats (4-inch brim around the entire circumference of the hat). Call for new or changing lesions.   Destruction of lesion - Scalp, face, ears x 20 Complexity: simple   Destruction method: cryotherapy   Informed  consent: discussed and consent obtained   Timeout:  patient name, date of birth, surgical site, and procedure verified Lesion destroyed using liquid nitrogen: Yes   Region frozen until ice ball extended beyond lesion: Yes   Outcome: patient tolerated procedure well with no complications   Post-procedure details: wound care instructions given    Inflamed seborrheic keratosis (5) scalp, face x 5 Reassured benign age-related growth.  Recommend observation.  Discussed cryotherapy if spot(s) become irritated or inflamed.  Areas are irritated. Destruction of lesion - scalp, face x 5 Complexity: simple   Destruction method: cryotherapy   Informed consent: discussed and consent obtained   Timeout:  patient name, date of birth, surgical site, and procedure verified Lesion destroyed using liquid nitrogen: Yes   Region frozen until ice ball extended beyond lesion: Yes   Outcome: patient tolerated procedure well with no complications   Post-procedure details: wound care instructions given    Epidermal inclusion cyst left med canthus Benign-appearing. Exam most consistent with an epidermal inclusion cyst. Discussed that a cyst is a benign growth that can grow over time and sometimes get irritated or inflamed. Recommend observation if it is not bothersome. Discussed option of surgical excision to remove it if it is growing, symptomatic, or other changes noted. Please call for new or changing lesions so they can be evaluated.   Neoplasm of skin left inferior cheek Suspicious for Skin Cancer (BCC vs SCC) Crusted papule Schedule surgery Advised pt to shave beard in this area prior to surgery.  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  - Call if any new or changing  lesions are noted between office visits  History of PreCancerous Actinic Keratosis Biopsy proven  Right lateral base of neck 04/12/2021  - site(s) of PreCancerous Actinic Keratosis clear today. - these may recur and new lesions may form requiring treatment to prevent transformation into skin cancer - observe for new or changing spots and contact [facility] for appointment if occur - photoprotection with sun protective clothing; sunglasses and broad spectrum sunscreen with SPF of at least 30 + and frequent self skin exams recommended - yearly exams by a dermatologist recommended for persons with history of PreCancerous Actinic Keratoses    Purpura - Chronic; persistent and recurrent.  Treatable, but not curable. - Violaceous macules and patches - Benign - Related to trauma, age, sun damage and/or use of blood thinners, chronic use of topical and/or oral steroids - Observe - Can use OTC arnica containing moisturizer such as Dermend Bruise Formula if desired - Call for worsening or other concerns   Actinic Damage - chronic, secondary to cumulative UV radiation exposure/sun exposure over time - diffuse scaly erythematous macules with underlying dyspigmentation - Recommend daily broad spectrum sunscreen SPF 30+ to sun-exposed areas, reapply every 2 hours as needed.  - Recommend staying in the shade or wearing long sleeves, sun glasses (UVA+UVB protection) and wide brim hats (4-inch brim around the entire circumference of the hat). - Call for new or changing lesions.   Return for surgery left cheek and recheck AKs, ISKs during surgery appt .  IMarye Round, CMA, am acting as scribe for Sarina Ser, MD .  Documentation: I have reviewed the above documentation for accuracy and completeness, and I agree with the above.  Sarina Ser, MD

## 2021-07-14 NOTE — Patient Instructions (Addendum)
? ?Pre-Operative Instructions ? ?You are scheduled for a surgical procedure at [facility]. We recommend you read the following instructions. If you have any questions or concerns, please call the office at [phone]. ? ?Shower and wash the entire body with soap and water the day of your surgery paying special attention to cleansing at and around the planned surgery site. ? ?Avoid aspirin or aspirin containing products at least fourteen (14) days prior to your surgical procedure and for at least one week (7 Days) after your surgical procedure. If you take aspirin on a regular basis for heart disease or history of stroke or for any other reason, we may recommend you continue taking aspirin but please notify us if you take this on a regular basis. Aspirin can cause more bleeding to occur during surgery as well as prolonged bleeding and bruising after surgery.  ? ?Avoid other nonsteroidal pain medications at least one week prior to surgery and at least one week prior to your surgery. These include medications such as Ibuprofen (Motrin, Advil and Nuprin), Naprosyn, Voltaren, Relafen, etc. If medications are used for therapeutic reasons, please inform us as they can cause increased bleeding or prolonged bleeding during and bruising after surgical procedures.  ? ?Please advise [place] if you are taking any "blood thinner" medications such as Coumadin or Dipyridamole or Plavix or similar medications. These cause increased bleeding and prolonged bleeding during procedures and bruising after surgical procedures. We may have to consider discontinuing these medications briefly prior to and shortly after your surgery if safe to do so.  ? ?Please inform us of all medications you are currently taking. All medications that are taken regularly should be taken the day of surgery as you always do. Nevertheless, we need to be informed of what medications you are taking prior to surgery to know whether they will affect the  procedure or cause any complications.  ? ?Please inform us of any medication allergies. Also inform us of whether you have allergies to Latex or rubber products or whether you have had any adverse reaction to Lidocaine or Epinephrine. ? ?Please inform us of any prosthetic or artificial body parts such as artificial heart valve, joint replacements, etc., or similar condition that might require preoperative antibiotics.  ? ?We recommend avoidance of alcohol at least two weeks prior to surgery and continued avoidance for at least two weeks after surgery.  ? ?We recommend discontinuation of tobacco smoking at least two weeks prior to surgery and continued abstinence for at least two weeks after surgery. ? ?Do not plan strenuous exercise, strenuous work or strenuous lifting for approximately four weeks after your surgery.  ? ?We request if you are unable to make your scheduled surgical appointment, please call us at least a week in advance or as soon as you are aware of a problem so that we can cancel or reschedule the appointment.  ? ?You MAY TAKE TYLENOL (acetaminophen) for pain as it is not a blood thinner.  ? ?PLEASE PLAN TO BE IN TOWN FOR TWO WEEKS FOLLOWING SURGERY, THIS IS IMPORTANT SO YOU CAN BE CHECKED FOR DRESSING CHANGES, SUTURE REMOVAL AND TO MONITOR FOR POSSIBLE COMPLICATIONS.  ? ? ? ? ? ?Cryotherapy Aftercare ? ?Wash gently with soap and water everyday.   ?Apply Vaseline and Band-Aid daily until healed.  ? ? ?If You Need Anything After Your Visit ? ?If you have any questions or concerns for your doctor, please call our main line at [phone] and press option  4 to reach your doctor's medical assistant. If no one answers, please leave a voicemail as directed and we will return your call as soon as possible. Messages left after 4 pm will be answered the following business day.  ? ?You may also send [place] a message via MyChart. We typically respond to MyChart messages within 1-2 business days. ? ?For  prescription refills, please ask your pharmacy to contact our office. Our fax number is [phone]. ? ?If you have an urgent issue when the clinic is closed that cannot wait until the next business day, you can page your doctor at the number below.   ? ?Please note that while we do our best to be available for urgent issues outside of office hours, we are not available 24/7.  ? ?If you have an urgent issue and are unable to reach [place], you may choose to seek medical care at your doctor's office, retail clinic, urgent care center, or emergency room. ? ?If you have a medical emergency, please immediately call 911 or go to the emergency department. ? ?Pager Numbers ? ?- Dr. Nehemiah Massed: [phone] ? ?- Dr. Laurence Ferrari: [phone] ? ?- Dr. Nicole Kindred: [phone] ? ?In the event of inclement weather, please call our main line at [phone] for an update on the status of any delays or closures. ? ?Dermatology Medication Tips: ?Please keep the boxes that topical medications come in in order to help keep track of the instructions about where and how to use these. Pharmacies typically print the medication instructions only on the boxes and not directly on the medication tubes.  ? ?If your medication is too expensive, please contact our office at [phone] option 4 or send [place] a message through [facility].  ? ?We are unable to tell what your co-pay for medications will be in advance as this is different depending on your insurance coverage. However, we may be able to find a substitute medication at lower cost or fill out paperwork to get insurance to cover a needed medication.  ? ?If a prior authorization is required to get your medication covered by your insurance company, please allow [place] 1-2 business days to complete this process. ? ?Drug prices often vary depending on where the prescription is filled and some pharmacies may offer cheaper prices. ? ?The website www.goodrx.com contains coupons for medications through different  pharmacies. The prices here do not account for what the cost may be with help from insurance (it may be cheaper with your insurance), but the website can give you the price if you did not use any insurance.  ?- You can print the associated coupon and take it with your prescription to the pharmacy.  ?- You may also stop by our office during regular business hours and pick up a GoodRx coupon card.  ?- If you need your prescription sent electronically to a different pharmacy, notify our office through [facility] or by phone at [phone] option 4. ? ? ? ? ?Si Usted Necesita Algo Despu?s de Su Visita ? ?Tambi?n puede enviarnos un mensaje a trav?s de MyChart. Por lo general respondemos a los mensajes de MyChart en el transcurso de 1 a 2 d?as h?biles. ? ?Para renovar recetas, por favor pida a su farmacia que se ponga en contacto con nuestra oficina. Nuestro n?mero de fax es el [phone]. ? ?Si tiene un asunto urgente cuando la cl?nica est? cerrada y que no puede esperar hasta el siguiente d?a h?bil, puede llamar/localizar a su doctor(a) al n?mero que aparece  a continuaci?n.  ? ?Por favor, tenga en cuenta que aunque hacemos todo lo posible para estar disponibles para asuntos urgentes fuera del horario de oficina, no estamos disponibles las 24 horas del d?a, los 7 d?as de la semana.  ? ?Si tiene un problema urgente y no puede comunicarse con nosotros, puede optar por buscar atenci?n m?dica  en el consultorio de su doctor(a), en una cl?nica privada, en un centro de atenci?n urgente o en una sala de emergencias. ? ?Si tiene Engineer, maintenance (IT) m?dica, por favor llame inmediatamente al 911 o vaya a la sala de emergencias. ? ?N?meros de b?per ? ?- Dr. Nehemiah Massed: [phone] ? ?- Dra. Moye: [phone] ? ?- Dra. Nicole Kindred: [phone] ? ?En caso de inclemencias del tiempo, por favor llame a nuestra l?nea principal al [phone] para una actualizaci?n sobre el estado de cualquier retraso o cierre. ? ?Consejos para la  medicaci?n en dermatolog?a: ?Por favor, guarde las cajas en las que vienen los medicamentos de uso t?pico para ayudarle a seguir las instrucciones sobre d?nde y c?mo usarlos. Las farmacias generalmente imprimen las ins

## 2021-07-23 ENCOUNTER — Encounter: Payer: Self-pay | Admitting: Dermatology

## 2021-08-01 IMAGING — NM NM RENAL IMAGING FLOW W/O PHARM
4 series · 14 of 14 positions shown · non-contrast
Comparison: 10/23/2019

CLINICAL DATA: Renal calculi, right renal atrophy

EXAM:
NUCLEAR MEDICINE RENAL SCAN WITHOUT DIURETIC ADMINISTRATION
TECHNIQUE: Radionuclide angiographic and sequential renal images were obtained
after intravenous injection of radiopharmaceutical.
RADIOPHARMACEUTICALS:  5.2 mCi Mechnetium-AAm MAG3 IV

[Series 1: renal scan · 4.14mm/px · 6 of 40 frames shown (1 of 2)]
[frame 4/40  full-range]
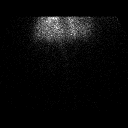
[frame 10/40  full-range]
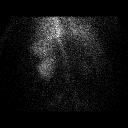
[frame 17/40  full-range]
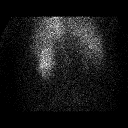
[frame 24/40  full-range]
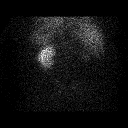
[frame 30/40  full-range]
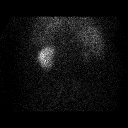
[frame 37/40  full-range]
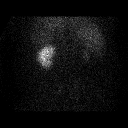

[Series 1: renal scan · 4.14mm/px · 6 of 90 frames shown (2 of 2)]
[frame 8/90]
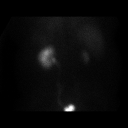
[frame 23/90]
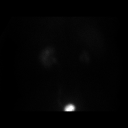
[frame 38/90]
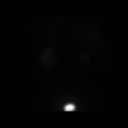
[frame 53/90]
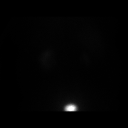
[frame 68/90]
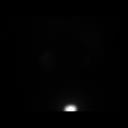
[frame 83/90]
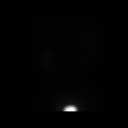

[Series 2: pre void · non-contrast · 2.07mm/px · 1 of 1 slices shown]
[im 1/1]
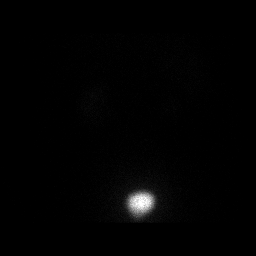

[Series 3: post void · 2.07mm/px · 1 of 1 slices shown]
[im 1/1  full-range]
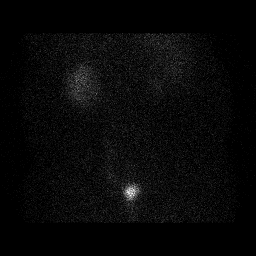

[14 of 14 positions shown; findings below may reference images not displayed]

FINDINGS: Flow: Normal flow to the left kidney. Diminished flow to the
atrophic right kidney seen on previous CT.

Left renogram: Normal left renogram curve, with time to peak
activity measuring 3.7 minutes. T1 half measures 5.6 minutes. No
evidence of obstruction.

Right renogram: Blunted right renogram curve, with time to peak
activity measuring 15.7 minutes. T1 half could not be calculated. No
evidence of obstruction.

Differential:

Left kidney = 93 %

Right kidney = 7 %
IMPRESSION: 1. Marked right renal atrophy, with only 7% of radiotracer uptake
within the right renal parenchyma. Minimal function of the right
renal parenchyma.
2. Normal scintigraphic evaluation of the left kidney. Normal uptake
and excretion, with no evidence of obstruction.

## 2021-08-12 ENCOUNTER — Telehealth: Payer: Self-pay

## 2021-08-12 NOTE — Telephone Encounter (Signed)
Pt report the spot on his cheek that Dr Raliegh Ip scheduled him for surgery for June 20 is now gone, discussed with pt please shave beard and come here for a recheck to make sure spot is completely gone before we cancel his surgery appt

## 2021-08-12 NOTE — Addendum Note (Signed)
Addended by: Marye Round on: 08/12/2021 09:31 AM   Modules accepted: Orders

## 2021-08-16 ENCOUNTER — Ambulatory Visit (INDEPENDENT_AMBULATORY_CARE_PROVIDER_SITE_OTHER): Payer: Medicare Other | Admitting: Dermatology

## 2021-08-16 ENCOUNTER — Encounter: Payer: Self-pay | Admitting: Dermatology

## 2021-08-16 DIAGNOSIS — D492 Neoplasm of unspecified behavior of bone, soft tissue, and skin: Secondary | ICD-10-CM

## 2021-08-16 DIAGNOSIS — L570 Actinic keratosis: Secondary | ICD-10-CM | POA: Diagnosis not present

## 2021-08-16 DIAGNOSIS — L578 Other skin changes due to chronic exposure to nonionizing radiation: Secondary | ICD-10-CM

## 2021-08-16 DIAGNOSIS — B078 Other viral warts: Secondary | ICD-10-CM | POA: Diagnosis not present

## 2021-08-16 MED ORDER — FLUOROURACIL 5 % EX CREA: TOPICAL_CREAM | CUTANEOUS | 2 refills | Status: DC

## 2021-08-16 NOTE — Patient Instructions (Addendum)
Beginning in 1 month:  Start 5-fluorouracil/calcipotriene cream twice a day for 7 days to affected areas including temples. Prescription sent to [facility]. Patient provided with contact information for pharmacy and advised the pharmacy will mail the prescription to their home. Patient provided with handout reviewing treatment course and side effects and advised to call or message [place] on MyChart with any concerns.    5-Fluorouracil/Calcipotriene Patient Education   Actinic keratoses are the dry, red scaly spots on the skin caused by sun damage. A portion of these spots can turn into skin cancer with time, and treating them can help prevent development of skin cancer.   Treatment of these spots requires removal of the defective skin cells. There are various ways to remove actinic keratoses, including freezing with liquid nitrogen, treatment with creams, or treatment with a blue light procedure in the office.   5-fluorouracil cream is a topical cream used to treat actinic keratoses. It works by interfering with the growth of abnormal fast-growing skin cells, such as actinic keratoses. These cells peel off and are replaced by healthy ones.   5-fluorouracil/calcipotriene is a combination of the 5-fluorouracil cream with a vitamin D analog cream called calcipotriene. The calcipotriene alone does not treat actinic keratoses. However, when it is combined with 5-fluorouracil, it helps the 5-fluorouracil treat the actinic keratoses much faster so that the same results can be achieved with a much shorter treatment time.  INSTRUCTIONS FOR 5-FLUOROURACIL/CALCIPOTRIENE CREAM:   5-fluorouracil/calcipotriene cream typically only needs to be used for 4-7 days. A thin layer should be applied twice a day to the treatment areas recommended by your physician.   If your physician prescribed you separate tubes of 5-fluourouracil and calcipotriene, apply a thin layer of 5-fluorouracil followed by a thin layer of  calcipotriene.   Avoid contact with your eyes, nostrils, and mouth. Do not use 5-fluorouracil/calcipotriene cream on infected or open wounds.   You will develop redness, irritation and some crusting at areas where you have pre-cancer damage/actinic keratoses. IF YOU DEVELOP PAIN, BLEEDING, OR SIGNIFICANT CRUSTING, STOP THE TREATMENT EARLY - you have already gotten a good response and the actinic keratoses should clear up well.  Wash your hands after applying 5-fluorouracil 5% cream on your skin.   A moisturizer or sunscreen with a minimum SPF 30 should be applied each morning.   Once you have finished the treatment, you can apply a thin layer of Vaseline twice a day to irritated areas to soothe and calm the areas more quickly. If you experience significant discomfort, contact your physician.  For some patients it is necessary to repeat the treatment for best results.  SIDE EFFECTS: When using 5-fluorouracil/calcipotriene cream, you may have mild irritation, such as redness, dryness, swelling, or a mild burning sensation. This usually resolves within 2 weeks. The more actinic keratoses you have, the more redness and inflammation you can expect during treatment. Eye irritation has been reported rarely. If this occurs, please let us know.  If you have any trouble using this cream, please call the office. If you have any other questions about this information, please do not hesitate to ask me before you leave the office.   Cryotherapy Aftercare  Wash gently with soap and water everyday.   Apply Vaseline daily until healed.   Prior to procedure, discussed risks of blister formation, small wound, skin dyspigmentation, or rare scar following cryotherapy. Recommend Vaseline ointment to treated areas while healing.    Electrodesiccation and Curettage ("Scrape and Burn") Wound Care  Instructions  Leave the original bandage on for 24 hours if possible.  If the bandage becomes soaked or soiled before  that time, it is OK to remove it and examine the wound.  A small amount of post-operative bleeding is normal.  If excessive bleeding occurs, remove the bandage, place gauze over the site and apply continuous pressure (no peeking) over the area for 30 minutes. If this does not work, please call our clinic as soon as possible or page your doctor if it is after hours.   Once a day, cleanse the wound with soap and water. It is fine to shower. If a thick crust develops you may use a Q-tip dipped into dilute hydrogen peroxide (mix 1:1 with water) to dissolve it.  Hydrogen peroxide can slow the healing process, so use it only as needed.    After washing, apply petroleum jelly (Vaseline) or an antibiotic ointment if your doctor prescribed one for you, followed by a bandage.    For best healing, the wound should be covered with a layer of ointment at all times. If you are not able to keep the area covered with a bandage to hold the ointment in place, this may mean re-applying the ointment several times a day.  Continue this wound care until the wound has healed and is no longer open. It may take several weeks for the wound to heal and close.  Itching and mild discomfort is normal during the healing process.  If you have any discomfort, you can take Tylenol (acetaminophen) or ibuprofen as directed on the bottle. (Please do not take these if you have an allergy to them or cannot take them for another reason).  Some redness, tenderness and white or yellow material in the wound is normal healing.  If the area becomes very sore and red, or develops a thick yellow-green material (pus), it may be infected; please notify us.    Wound healing continues for up to one year following surgery. It is not unusual to experience pain in the scar from time to time during the interval.  If the pain becomes severe or the scar thickens, you should notify the office.    A slight amount of redness in a scar is expected for the  first six months.  After six months, the redness will fade and the scar will soften and fade.  The color difference becomes less noticeable with time.  If there are any problems, return for a post-op surgery check at your earliest convenience.  To improve the appearance of the scar, you can use silicone scar gel, cream, or sheets (such as Mederma or Serica) every night for up to one year. These are available over the counter (without a prescription).  Please call our office at [phone] for any questions or concerns.   If You Need Anything After Your Visit  If you have any questions or concerns for your doctor, please call our main line at [phone] and press option 4 to reach your doctor's medical assistant. If no one answers, please leave a voicemail as directed and we will return your call as soon as possible. Messages left after 4 pm will be answered the following business day.   You may also send [place] a message via [place]. We typically respond to MyChart messages within 1-2 business days.  For prescription refills, please ask your pharmacy to contact our office. Our fax number is [phone].  If you have an urgent issue when the clinic is  closed that cannot wait until the next business day, you can page your doctor at the number below.    Please note that while we do our best to be available for urgent issues outside of office hours, we are not available 24/7.   If you have an urgent issue and are unable to reach [place], you may choose to seek medical care at your doctor's office, retail clinic, urgent care center, or emergency room.  If you have a medical emergency, please immediately call 911 or go to the emergency department.  Pager Numbers  - Dr. Nehemiah Massed: [phone]  - Dr. Laurence Ferrari: [phone]  - Dr. Nicole Kindred: [phone]  In the event of inclement weather, please call our main line at [phone] for an update on the status of any delays or closures.  Dermatology  Medication Tips: Please keep the boxes that topical medications come in in order to help keep track of the instructions about where and how to use these. Pharmacies typically print the medication instructions only on the boxes and not directly on the medication tubes.   If your medication is too expensive, please contact our office at [phone] option 4 or send [place] a message through [place].   We are unable to tell what your co-pay for medications will be in advance as this is different depending on your insurance coverage. However, we may be able to find a substitute medication at lower cost or fill out paperwork to get insurance to cover a needed medication.   If a prior authorization is required to get your medication covered by your insurance company, please allow [place] 1-2 business days to complete this process.  Drug prices often vary depending on where the prescription is filled and some pharmacies may offer cheaper prices.  The website www.goodrx.com contains coupons for medications through different pharmacies. The prices here do not account for what the cost may be with help from insurance (it may be cheaper with your insurance), but the website can give you the price if you did not use any insurance.  - You can print the associated coupon and take it with your prescription to the pharmacy.  - You may also stop by our office during regular business hours and pick up a GoodRx coupon card.  - If you need your prescription sent electronically to a different pharmacy, notify our office through [facility] or by phone at [phone] option 4.     Si Usted Necesita Algo Despus de Su Visita  Tambin puede enviarnos un mensaje a travs de Pharmacist, community. Por lo general respondemos a los mensajes de MyChart en el transcurso de 1 a 2 das hbiles.  Para renovar recetas, por favor pida a su farmacia que se ponga en contacto con nuestra oficina. Harland Dingwall de fax es [place] [phone].  Si  tiene un asunto urgente cuando la clnica est cerrada y que no puede esperar hasta el siguiente da hbil, puede llamar/localizar a su doctor(a) al nmero que aparece a continuacin.   Por favor, tenga en cuenta que aunque hacemos todo lo posible para estar disponibles para asuntos urgentes fuera del horario de [place], no estamos disponibles las 24 horas del da, los 7 das de la [place].   Si tiene un problema urgente y no puede comunicarse con nosotros, puede optar por buscar atencin mdica  en el consultorio de su doctor(a), en una clnica privada, en un centro de atencin urgente o en una sala de emergencias.  Si tiene [employer],  por favor llame inmediatamente al 911 o vaya a la sala de emergencias.  Nmeros de bper  - Dr. Nehemiah Massed: [phone]  - Dra. Moye: [phone]  - Dra. Nicole Kindred: [phone]  En caso de inclemencias del [place], por favor llame a Johnsie Kindred principal al [phone] para una actualizacin sobre el [place] de cualquier retraso o cierre.  Consejos para la medicacin en dermatologa: Por favor, guarde las cajas en las que vienen los medicamentos de uso tpico para ayudarle a seguir las instrucciones sobre dnde y cmo usarlos. Las farmacias generalmente imprimen las instrucciones del medicamento slo en las cajas y no directamente en los tubos del [place].   Si su medicamento es muy caro, por favor, pngase en contacto con Zigmund Daniel llamando al [phone] y presione la opcin 4 o envenos un mensaje a travs de Pharmacist, community.   No podemos decirle cul ser su copago por los medicamentos por adelantado ya que esto es diferente dependiendo de la cobertura de su seguro. Sin embargo, es posible que podamos encontrar un medicamento sustituto a Electrical engineer un formulario para que el seguro cubra el medicamento que se considera necesario.   Si se requiere una autorizacin previa para que su compaa de seguros [place] su medicamento, por favor  permtanos de 1 a 2 das hbiles para completar este proceso.  Los precios de los medicamentos varan con frecuencia dependiendo del Environmental consultant de dnde se surte la receta y alguna farmacias pueden ofrecer precios ms baratos.  El sitio web www.goodrx.com tiene cupones para medicamentos de Airline pilot. Los precios aqu no tienen en cuenta lo que podra costar con la ayuda del seguro (puede ser ms barato con su seguro), pero el sitio web puede darle el precio si no utiliz Research scientist (physical sciences).  - Puede imprimir el cupn correspondiente y llevarlo con su receta a la farmacia.  - Tambin puede pasar por nuestra oficina durante el horario de atencin regular y Charity fundraiser una tarjeta de cupones de GoodRx.  - Si necesita que su receta se enve electrnicamente a una farmacia diferente, informe a nuestra oficina a travs de MyChart de [place] o por telfono llamando al [phone] y presione la opcin 4.

## 2021-08-16 NOTE — Progress Notes (Signed)
Follow-Up Visit   Subjective  Michael Cantrell is a 71 y.o. male who presents for the following: lesion (Recheck lesion on left cheek. Scheduled for surgery next month. Patient thinks is better, wonders if still needs surgery). The patient has spots, moles and lesions to be evaluated, some may be new or changing and the patient has concerns that these could be cancer.  The following portions of the chart were reviewed this encounter and updated as appropriate:  Tobacco  Allergies  Meds  Problems  Med Hx  Surg Hx  Fam Hx     Review of Systems: No other skin or systemic complaints except as noted in HPI or Assessment and Plan.  Objective  Well appearing patient in no apparent distress; mood and affect are within normal limits.  A focused examination was performed including head, including the scalp, face, neck, nose, ears, eyelids, and lips. Relevant physical exam findings are noted in the Assessment and Plan.  face, ears and scalp  x21 (21) Erythematous thin papules/macules with gritty scale.   Left Inferior Cheek 1.2 x 0.8 cm hyperkeratotic papule      Assessment & Plan   Actinic Damage - Severe, confluent actinic changes with pre-cancerous actinic keratoses  - Severe, chronic, not at goal, secondary to cumulative UV radiation exposure over time - diffuse scaly erythematous macules and papules with underlying dyspigmentation - Discussed Prescription "Field Treatment" for Severe, Chronic Confluent Actinic Changes with Pre-Cancerous Actinic Keratoses Field treatment involves treatment of an entire area of skin that has confluent Actinic Changes (Sun/ Ultraviolet light damage) and PreCancerous Actinic Keratoses by method of PhotoDynamic Therapy (PDT) and/or prescription Topical Chemotherapy agents such as 5-fluorouracil, 5-fluorouracil/calcipotriene, and/or imiquimod.  The purpose is to decrease the number of clinically evident and subclinical PreCancerous lesions to prevent  progression to development of skin cancer by chemically destroying early precancer changes that may or may not be visible.  It has been shown to reduce the risk of developing skin cancer in the treated area. As a result of treatment, redness, scaling, crusting, and open sores may occur during treatment course. One or more than one of these methods may be used and may have to be used several times to control, suppress and eliminate the PreCancerous changes. Discussed treatment course, expected reaction, and possible side effects. - Recommend daily broad spectrum sunscreen SPF 30+ to sun-exposed areas, reapply every 2 hours as needed.  - Staying in the shade or wearing long sleeves, sun glasses (UVA+UVB protection) and wide brim hats (4-inch brim around the entire circumference of the hat) are also recommended. - Call for new or changing lesions.   Begin in 1 month: Start 5-fluorouracil/calcipotriene cream twice a day for 7 days to affected areas including temples. Prescription sent to [facility]. Patient provided with contact information for pharmacy and advised the pharmacy will mail the prescription to their home. Patient provided with handout reviewing treatment course and side effects and advised to call or message [place] on MyChart with any concerns.   AK (actinic keratosis) (21) face, ears and scalp  x21 Actinic keratoses are precancerous spots that appear secondary to cumulative UV radiation exposure/sun exposure over time. They are chronic with expected duration over 1 year. A portion of actinic keratoses will progress to squamous cell carcinoma of the skin. It is not possible to reliably predict which spots will progress to skin cancer and so treatment is recommended to prevent development of skin cancer. Recommend daily broad spectrum sunscreen SPF 30+  to sun-exposed areas, reapply every 2 hours as needed.  Recommend staying in the shade or wearing long sleeves, sun glasses (UVA+UVB  protection) and wide brim hats (4-inch brim around the entire circumference of the hat). Call for new or changing lesions.  Destruction of lesion - face, ears and scalp  x21 Complexity: simple   Destruction method: cryotherapy   Informed consent: discussed and consent obtained   Timeout:  patient name, date of birth, surgical site, and procedure verified Lesion destroyed using liquid nitrogen: Yes   Region frozen until ice ball extended beyond lesion: Yes   Outcome: patient tolerated procedure well with no complications   Post-procedure details: wound care instructions given    fluorouracil (EFUDEX) 5 % cream - face, ears and scalp  x21 Apply twice daily to temples for 7 days  Neoplasm of skin Left Inferior Cheek Epidermal / dermal shaving  Lesion diameter (cm):  1.2 Informed consent: discussed and consent obtained   Timeout: patient name, date of birth, surgical site, and procedure verified   Procedure prep:  Patient was prepped and draped in usual sterile fashion Prep type:  Isopropyl alcohol Anesthesia: the lesion was anesthetized in a standard fashion   Anesthetic:  1% lidocaine w/ epinephrine 1-100,000 buffered w/ 8.4% NaHCO3 Instrument used: flexible razor blade   Hemostasis achieved with: pressure, aluminum chloride and electrodesiccation   Outcome: patient tolerated procedure well   Post-procedure details: sterile dressing applied and wound care instructions given   Dressing type: bandage and petrolatum    Destruction of lesion Complexity: extensive   Destruction method: electrodesiccation and curettage   Informed consent: discussed and consent obtained   Timeout:  patient name, date of birth, surgical site, and procedure verified Procedure prep:  Patient was prepped and draped in usual sterile fashion Prep type:  Isopropyl alcohol Anesthesia: the lesion was anesthetized in a standard fashion   Anesthetic:  1% lidocaine w/ epinephrine 1-100,000 buffered w/ 8.4%  NaHCO3 Curettage performed in three different directions: Yes   Electrodesiccation performed over the curetted area: Yes   Curettage cycles:  3 Lesion length (cm):  1.2 Lesion width (cm):  0.8 Margin per side (cm):  0.2 Final wound size (cm):  1.6 Hemostasis achieved with:  pressure and aluminum chloride Outcome: patient tolerated procedure well with no complications   Post-procedure details: sterile dressing applied and wound care instructions given   Dressing type: bandage and petrolatum    Specimen 1 - Surgical pathology Differential Diagnosis: BCC vs SCC Check Margins: No  Return in about 3 months (around 11/16/2021) for AK Follow Up, Biopsy Follow Up.  I, Emelia Salisbury, CMA, am acting as scribe for Sarina Ser, MD. Documentation: I have reviewed the above documentation for accuracy and completeness, and I agree with the above.  Sarina Ser, MD

## 2021-08-18 ENCOUNTER — Telehealth: Payer: Self-pay

## 2021-08-18 NOTE — Telephone Encounter (Signed)
-----   Message from Ralene Bathe, MD sent at 08/18/2021  5:00 PM EDT ----- Diagnosis Skin , left inferior cheek VERRUCA VULGARIS, IRRITATED, BASE INVOLVED  Benign viral wart Already treated May recur No further treatment at this time

## 2021-08-18 NOTE — Telephone Encounter (Signed)
Patient informed of pathology results 

## 2021-08-22 ENCOUNTER — Encounter: Payer: Self-pay | Admitting: Dermatology

## 2021-09-14 ENCOUNTER — Encounter: Payer: Medicare Other | Admitting: Dermatology

## 2021-11-11 ENCOUNTER — Other Ambulatory Visit: Payer: Medicare Other

## 2021-11-11 DIAGNOSIS — C61 Malignant neoplasm of prostate: Secondary | ICD-10-CM

## 2021-11-12 LAB — PSA: Prostate Specific Ag, Serum: 0.4 ng/mL (ref 0.0–4.0)

## 2021-11-17 ENCOUNTER — Ambulatory Visit: Payer: Medicare Other | Admitting: Urology

## 2021-11-18 ENCOUNTER — Other Ambulatory Visit (INDEPENDENT_AMBULATORY_CARE_PROVIDER_SITE_OTHER): Payer: Self-pay | Admitting: Vascular Surgery

## 2021-11-18 ENCOUNTER — Encounter: Payer: Self-pay | Admitting: Urology

## 2021-11-18 ENCOUNTER — Ambulatory Visit (INDEPENDENT_AMBULATORY_CARE_PROVIDER_SITE_OTHER): Payer: Medicare Other | Admitting: Dermatology

## 2021-11-18 ENCOUNTER — Ambulatory Visit (INDEPENDENT_AMBULATORY_CARE_PROVIDER_SITE_OTHER): Payer: Medicare Other | Admitting: Urology

## 2021-11-18 VITALS — BP 152/80 | HR 85 | Ht 67.0 in | Wt 205.5 lb

## 2021-11-18 DIAGNOSIS — I6523 Occlusion and stenosis of bilateral carotid arteries: Secondary | ICD-10-CM | POA: Diagnosis not present

## 2021-11-18 DIAGNOSIS — R82998 Other abnormal findings in urine: Secondary | ICD-10-CM

## 2021-11-18 DIAGNOSIS — Z1283 Encounter for screening for malignant neoplasm of skin: Secondary | ICD-10-CM | POA: Diagnosis not present

## 2021-11-18 DIAGNOSIS — I771 Stricture of artery: Secondary | ICD-10-CM

## 2021-11-18 DIAGNOSIS — L578 Other skin changes due to chronic exposure to nonionizing radiation: Secondary | ICD-10-CM

## 2021-11-18 DIAGNOSIS — Z5111 Encounter for antineoplastic chemotherapy: Secondary | ICD-10-CM | POA: Diagnosis not present

## 2021-11-18 DIAGNOSIS — Z8546 Personal history of malignant neoplasm of prostate: Secondary | ICD-10-CM | POA: Diagnosis not present

## 2021-11-18 DIAGNOSIS — L814 Other melanin hyperpigmentation: Secondary | ICD-10-CM

## 2021-11-18 DIAGNOSIS — L821 Other seborrheic keratosis: Secondary | ICD-10-CM

## 2021-11-18 DIAGNOSIS — R3989 Other symptoms and signs involving the genitourinary system: Secondary | ICD-10-CM

## 2021-11-18 DIAGNOSIS — C61 Malignant neoplasm of prostate: Secondary | ICD-10-CM

## 2021-11-18 DIAGNOSIS — Z79899 Other long term (current) drug therapy: Secondary | ICD-10-CM | POA: Diagnosis not present

## 2021-11-18 DIAGNOSIS — D1800 Hemangioma unspecified site: Secondary | ICD-10-CM

## 2021-11-18 DIAGNOSIS — L570 Actinic keratosis: Secondary | ICD-10-CM

## 2021-11-18 DIAGNOSIS — D229 Melanocytic nevi, unspecified: Secondary | ICD-10-CM

## 2021-11-18 LAB — URINALYSIS, COMPLETE
Bilirubin, UA: NEGATIVE
Glucose, UA: NEGATIVE
Nitrite, UA: POSITIVE — AB
Specific Gravity, UA: 1.02 (ref 1.005–1.030)
Urobilinogen, Ur: 1 mg/dL (ref 0.2–1.0)
pH, UA: 6.5 (ref 5.0–7.5)

## 2021-11-18 LAB — MICROSCOPIC EXAMINATION
RBC, Urine: >30 /hpf — AB (ref 0–2)
WBC, UA: >30 /hpf — AB (ref 0–5)

## 2021-11-18 MED ORDER — SULFAMETHOXAZOLE-TRIMETHOPRIM 800-160 MG PO TABS: 1.0000 | ORAL_TABLET | Freq: Two times a day (BID) | ORAL | 0 refills | Status: AC

## 2021-11-18 NOTE — Progress Notes (Signed)
Follow-Up Visit   Subjective  Michael Cantrell is a 71 y.o. male who presents for the following: Actinic Keratosis (Of the face and scalp - S/P LN2, patient did not treat areas with 5FU/Calcipotriene mix due to wife's illness). The patient has spots, moles and lesions to be evaluated, some may be new or changing.  The following portions of the chart were reviewed this encounter and updated as appropriate:   Tobacco  Allergies  Meds  Problems  Med Hx  Surg Hx  Fam Hx     Review of Systems:  No other skin or systemic complaints except as noted in HPI or Assessment and Plan.  Objective  Well appearing patient in no apparent distress; mood and affect are within normal limits.  All skin waist up examined.  Scalp x 15, face x 11 (26) Erythematous thin papules/macules with gritty scale.    Assessment & Plan  AK (actinic keratosis) (26) Scalp x 15, face x 11  Hypertrophic   Destruction of lesion - Scalp x 15, face x 11 Complexity: simple   Destruction method: cryotherapy   Informed consent: discussed and consent obtained   Timeout:  patient name, date of birth, surgical site, and procedure verified Lesion destroyed using liquid nitrogen: Yes   Region frozen until ice ball extended beyond lesion: Yes   Outcome: patient tolerated procedure well with no complications   Post-procedure details: wound care instructions given    Related Medications fluorouracil (EFUDEX) 5 % cream Apply twice daily to temples for 7 days  Lentigines - Scattered tan macules - Due to sun exposure - Benign-appearing, observe - Recommend daily broad spectrum sunscreen SPF 30+ to sun-exposed areas, reapply every 2 hours as needed. - Call for any changes  Seborrheic Keratoses - Stuck-on, waxy, tan-brown papules and/or plaques  - Benign-appearing - Discussed benign etiology and prognosis. - Observe - Call for any changes  Melanocytic Nevi - Tan-brown and/or pink-flesh-colored symmetric macules  and papules - Benign appearing on exam today - Observation - Call clinic for new or changing moles - Recommend daily use of broad spectrum spf 30+ sunscreen to sun-exposed areas.   Hemangiomas - Red papules - Discussed benign nature - Observe - Call for any changes  Actinic Damage - Severe, confluent actinic changes with pre-cancerous actinic keratoses  - Severe, chronic, not at goal, secondary to cumulative UV radiation exposure over time - diffuse scaly erythematous macules and papules with underlying dyspigmentation - Discussed Prescription "Field Treatment" for Severe, Chronic Confluent Actinic Changes with Pre-Cancerous Actinic Keratoses Field treatment involves treatment of an entire area of skin that has confluent Actinic Changes (Sun/ Ultraviolet light damage) and PreCancerous Actinic Keratoses by method of PhotoDynamic Therapy (PDT) and/or prescription Topical Chemotherapy agents such as 5-fluorouracil, 5-fluorouracil/calcipotriene, and/or imiquimod.  The purpose is to decrease the number of clinically evident and subclinical PreCancerous lesions to prevent progression to development of skin cancer by chemically destroying early precancer changes that may or may not be visible.  It has been shown to reduce the risk of developing skin cancer in the treated area. As a result of treatment, redness, scaling, crusting, and open sores may occur during treatment course. One or more than one of these methods may be used and may have to be used several times to control, suppress and eliminate the PreCancerous changes. Discussed treatment course, expected reaction, and possible side effects. - Recommend daily broad spectrum sunscreen SPF 30+ to sun-exposed areas, reapply every 2 hours as needed.  -  Staying in the shade or wearing long sleeves, sun glasses (UVA+UVB protection) and wide brim hats (4-inch brim around the entire circumference of the hat) are also recommended. - Call for new or changing  lesions.  - Starting October 1st apply 5FU/Calcipotriene cream BID x 7 days to the entire scalp. Starting October 15th treat the temples BID x 7 days. Patient has prescription at home.   Purpura - Chronic; persistent and recurrent.  Treatable, but not curable. - Violaceous macules and patches - Benign - Related to trauma, age, sun damage and/or use of blood thinners, chronic use of topical and/or oral steroids - Observe - Can use OTC arnica containing moisturizer such as Dermend Bruise Formula if desired - Call for worsening or other concerns  Skin cancer screening performed today.   Return for AK follow up in 5-6 mths.  Luther Redo, CMA, am acting as scribe for Sarina Ser, MD . Documentation: I have reviewed the above documentation for accuracy and completeness, and I agree with the above.  Sarina Ser, MD

## 2021-11-18 NOTE — Patient Instructions (Addendum)
Starting October 1st apply 5FU/Calcipotriene cream twice a day x 7 days to the scalp. On October 15th apply 5FU/Calcipotriene mix twice a day to the temples for 7 days.   Due to recent changes in healthcare laws, you may see results of your pathology and/or laboratory studies on MyChart before the doctors have had a chance to review them. We understand that in some cases there may be results that are confusing or concerning to you. Please understand that not all results are received at the same time and often the doctors may need to interpret multiple results in order to provide you with the best plan of care or course of treatment. Therefore, we ask that you please give [place] 2 business days to thoroughly review all your results before contacting the office for clarification. Should we see a critical lab result, you will be contacted sooner.   If You Need Anything After Your Visit  If you have any questions or concerns for your doctor, please call our main line at [phone] and press option 4 to reach your doctor's medical assistant. If no one answers, please leave a voicemail as directed and we will return your call as soon as possible. Messages left after 4 pm will be answered the following business day.   You may also send [place] a message via [place]. We typically respond to MyChart messages within 1-2 business days.  For prescription refills, please ask your pharmacy to contact our office. Our fax number is [phone].  If you have an urgent issue when the clinic is closed that cannot wait until the next business day, you can page your doctor at the number below.    Please note that while we do our best to be available for urgent issues outside of office hours, we are not available 24/7.   If you have an urgent issue and are unable to reach [place], you may choose to seek medical care at your doctor's office, retail clinic, urgent care center, or emergency room.  If you have a medical emergency,  please immediately call 911 or go to the emergency department.  Pager Numbers  - Dr. Nehemiah Massed: [phone]  - Dr. Laurence Ferrari: [phone]  - Dr. Nicole Kindred: [phone]  In the event of inclement weather, please call our main line at [phone] for an update on the status of any delays or closures.  Dermatology Medication Tips: Please keep the boxes that topical medications come in in order to help keep track of the instructions about where and how to use these. Pharmacies typically print the medication instructions only on the boxes and not directly on the medication tubes.   If your medication is too expensive, please contact our office at [phone] option 4 or send [place] a message through [place].   We are unable to tell what your co-pay for medications will be in advance as this is different depending on your insurance coverage. However, we may be able to find a substitute medication at lower cost or fill out paperwork to get insurance to cover a needed medication.   If a prior authorization is required to get your medication covered by your insurance company, please allow [place] 1-2 business days to complete this process.  Drug prices often vary depending on where the prescription is filled and some pharmacies may offer cheaper prices.  The website www.goodrx.com contains coupons for medications through different pharmacies. The prices here do not account for what the cost may be with help from insurance (it may  be cheaper with your insurance), but the website can give you the price if you did not use any insurance.  - You can print the associated coupon and take it with your prescription to the pharmacy.  - You may also stop by our office during regular business hours and pick up a GoodRx coupon card.  - If you need your prescription sent electronically to a different pharmacy, notify our office through [facility] or by phone at [phone] option 4.     Si Usted Necesita Algo  Despus de Su Visita  Tambin puede enviarnos un mensaje a travs de Pharmacist, community. Por lo general respondemos a los mensajes de MyChart en el transcurso de 1 a 2 das hbiles.  Para renovar recetas, por favor pida a su farmacia que se ponga en contacto con nuestra oficina. Harland Dingwall de fax es [place] [phone].  Si tiene un asunto urgente cuando la clnica est cerrada y que no puede esperar hasta el siguiente da hbil, puede llamar/localizar a su doctor(a) al nmero que aparece a continuacin.   Por favor, tenga en cuenta que aunque hacemos todo lo posible para estar disponibles para asuntos urgentes fuera del horario de [place], no estamos disponibles las 24 horas del da, los 7 das de la [place].   Si tiene un problema urgente y no puede comunicarse con nosotros, puede optar por buscar atencin mdica  en el consultorio de su doctor(a), en una clnica privada, en un centro de atencin urgente o en una sala de emergencias.  Si tiene Engineering geologist, por favor llame inmediatamente al 911 o vaya a la sala de emergencias.  Nmeros de bper  - Dr. Nehemiah Massed: [phone]  - Dra. Moye: [phone]  - Dra. Nicole Kindred: [phone]  En caso de inclemencias del [place], por favor llame a Johnsie Kindred principal al [phone] para una actualizacin sobre el [place] de cualquier retraso o cierre.  Consejos para la medicacin en dermatologa: Por favor, guarde las cajas en las que vienen los medicamentos de uso tpico para ayudarle a seguir las instrucciones sobre dnde y cmo usarlos. Las farmacias generalmente imprimen las instrucciones del medicamento slo en las cajas y no directamente en los tubos del [place].   Si su medicamento es muy caro, por favor, pngase en contacto con Zigmund Daniel llamando al [phone] y presione la opcin 4 o envenos un mensaje a travs de Pharmacist, community.   No podemos decirle cul ser su copago por los medicamentos por adelantado ya que esto es diferente  dependiendo de la cobertura de su seguro. Sin embargo, es posible que podamos encontrar un medicamento sustituto a Electrical engineer un formulario para que el seguro cubra el medicamento que se considera necesario.   Si se requiere una autorizacin previa para que su compaa de seguros [place] su medicamento, por favor permtanos de 1 a 2 das hbiles para completar este proceso.  Los precios de los medicamentos varan con frecuencia dependiendo del Environmental consultant de dnde se surte la receta y alguna farmacias pueden ofrecer precios ms baratos.  El sitio web www.goodrx.com tiene cupones para medicamentos de Airline pilot. Los precios aqu no tienen en cuenta lo que podra costar con la ayuda del seguro (puede ser ms barato con su seguro), pero el sitio web puede darle el precio si no utiliz Research scientist (physical sciences).  - Puede imprimir el cupn correspondiente y llevarlo con su receta a la farmacia.  - Tambin puede pasar por nuestra oficina durante el horario de atencin regular y  recoger una tarjeta de cupones de GoodRx.  - Si necesita que su receta se enve electrnicamente a una farmacia diferente, informe a nuestra oficina a travs de MyChart de [place] o por telfono llamando al [phone] y presione la opcin 4.

## 2021-11-18 NOTE — Progress Notes (Signed)
   11/18/2021 4:50 PM   NIRAV SWEDA January 13, 1951 [id number]  Reason for visit: Follow up history of prostate cancer, reported biochemical recurrence, stress incontinence, atrophic kidney, recent UTI  HPI: 71 year old very comorbid male who I inherited from Dr. Diona Fanti in [place]. He has a history of a perineal prostatectomy in 2000 for prostate cancer, and reportedly underwent radiation as well.  Grade and stage of prostate cancer at time of diagnosis unknown.  He apparently has been on Casodex and finasteride for biochemical recurrence from Dr. Diona Fanti. He reportedly stopped these medications a few years ago and his PSA increased to 18.  PSA was 0.2 in January 2023, and most recent PSA on 11/11/2021 was 0.4.  Urinalysis was checked a few days later and was grossly concerning for infection, and culture grew mixed species.  He does report some cloudy and mucousy urine.  I recommended a course of antibiotics for suspected UTI, and a urine and culture were sent today.  We discussed the effect of infection on the PSA, and I recommended continuing to trend this, especially in the setting of his numerous comorbidities.  He remains on Casodex and finasteride.  His wife has had a number of major health issues lately and he has been her primary caregiver.  Urine culture today, call with results Bactrim DS twice daily x10 days today started for presumed infection based on recent UA RTC 6 months PSA prior   Billey Co, MD  [place] [address] [place], [place] [address] [phone]

## 2021-11-20 ENCOUNTER — Encounter: Payer: Self-pay | Admitting: Dermatology

## 2021-11-22 ENCOUNTER — Encounter (INDEPENDENT_AMBULATORY_CARE_PROVIDER_SITE_OTHER): Payer: Medicare Other

## 2021-11-22 ENCOUNTER — Ambulatory Visit (INDEPENDENT_AMBULATORY_CARE_PROVIDER_SITE_OTHER): Payer: Medicare Other

## 2021-11-22 ENCOUNTER — Ambulatory Visit (INDEPENDENT_AMBULATORY_CARE_PROVIDER_SITE_OTHER): Payer: Medicare Other | Admitting: Nurse Practitioner

## 2021-11-22 DIAGNOSIS — I771 Stricture of artery: Secondary | ICD-10-CM | POA: Diagnosis not present

## 2021-11-22 DIAGNOSIS — I6523 Occlusion and stenosis of bilateral carotid arteries: Secondary | ICD-10-CM

## 2021-11-22 LAB — CULTURE, URINE COMPREHENSIVE

## 2022-05-04 ENCOUNTER — Ambulatory Visit: Payer: Medicare Other | Admitting: Dermatology

## 2022-05-20 ENCOUNTER — Other Ambulatory Visit: Payer: Medicare Other

## 2022-05-23 ENCOUNTER — Other Ambulatory Visit: Payer: Medicare Other

## 2022-05-23 DIAGNOSIS — C61 Malignant neoplasm of prostate: Secondary | ICD-10-CM

## 2022-05-24 LAB — PSA: Prostate Specific Ag, Serum: 0.3 ng/mL (ref 0.0–4.0)

## 2022-05-26 ENCOUNTER — Encounter: Payer: Self-pay | Admitting: Urology

## 2022-05-26 ENCOUNTER — Ambulatory Visit (INDEPENDENT_AMBULATORY_CARE_PROVIDER_SITE_OTHER): Payer: Medicare Other | Admitting: Urology

## 2022-05-26 VITALS — BP 119/65 | HR 71 | Ht 67.0 in | Wt 200.0 lb

## 2022-05-26 DIAGNOSIS — Z87440 Personal history of urinary (tract) infections: Secondary | ICD-10-CM | POA: Diagnosis not present

## 2022-05-26 DIAGNOSIS — Z8546 Personal history of malignant neoplasm of prostate: Secondary | ICD-10-CM

## 2022-05-26 DIAGNOSIS — C61 Malignant neoplasm of prostate: Secondary | ICD-10-CM

## 2022-05-26 DIAGNOSIS — N261 Atrophy of kidney (terminal): Secondary | ICD-10-CM | POA: Diagnosis not present

## 2022-05-26 DIAGNOSIS — N390 Urinary tract infection, site not specified: Secondary | ICD-10-CM

## 2022-05-26 MED ORDER — FINASTERIDE 5 MG PO TABS: 5.0000 mg | ORAL_TABLET | Freq: Every day | ORAL | 3 refills | Status: DC

## 2022-05-26 MED ORDER — BICALUTAMIDE 50 MG PO TABS: 50.0000 mg | ORAL_TABLET | Freq: Every day | ORAL | 3 refills | Status: DC

## 2022-05-26 NOTE — Progress Notes (Signed)
   05/26/2022 1:42 PM   Michael Cantrell Feb 10, 1951 [id number]  Reason for visit: Follow up history of prostate cancer, reported biochemical recurrence, stress incontinence, atrophic kidney, rUTI  HPI: 72 year old very co-morbid male who I inherited from Dr. Dahlstedt([facility]) in February 2023. He has a history of a perineal prostatectomy in 2000 for prostate cancer, and reportedly underwent radiation as well.  Grade and stage of prostate cancer at time of diagnosis unknown.  He apparently has been on Casodex and finasteride for biochemical recurrence from Dr. Diona Fanti. He reportedly stopped these medications a few years ago and his PSA increased to 18.    PSA was 0.2 in January 2023, and 0.4 in August 2023-however he was found to have a culture positive UTI at that time and did report some cloudy and mucousy urine with some mild dysuria and was treated with a 2-week course of Bactrim.  Most recent PSA from 05/23/2022 is stable at 0.3, and he remains on Casodex and finasteride.  He reports he was treated for another UTI about a month ago by PCP, but none of those records or labs are available to me.  He denies any urinary complaints today.  In terms of his atrophic kidney, most recent imaging with NM renal scan from August 2021 showed 93% function of the left kidney with only 7% function on the right kidney.  The etiology of his atrophic kidney is unclear, his CT from 2015 reportedly showed normal appearing kidneys bilaterally.  Renal function is normal, with most recent creatinine of 0.9, EGFR greater than 60.  Casodex and finasteride refilled RTC 9 months PVR and symptom check, sooner if problems-> if recurrent infections could consider CT urogram/cystoscopy for further evaluation  Billey Co, MD  [place] [address] [place], [place] [address] [phone]

## 2022-10-03 ENCOUNTER — Other Ambulatory Visit (INDEPENDENT_AMBULATORY_CARE_PROVIDER_SITE_OTHER): Payer: Self-pay | Admitting: Vascular Surgery

## 2022-10-03 DIAGNOSIS — I771 Stricture of artery: Secondary | ICD-10-CM

## 2022-10-11 ENCOUNTER — Ambulatory Visit (INDEPENDENT_AMBULATORY_CARE_PROVIDER_SITE_OTHER): Payer: PRIVATE HEALTH INSURANCE | Admitting: Nurse Practitioner

## 2022-10-11 ENCOUNTER — Encounter (INDEPENDENT_AMBULATORY_CARE_PROVIDER_SITE_OTHER): Payer: Medicare Other

## 2022-11-21 ENCOUNTER — Encounter (INDEPENDENT_AMBULATORY_CARE_PROVIDER_SITE_OTHER): Payer: Medicare Other

## 2022-11-21 ENCOUNTER — Ambulatory Visit (INDEPENDENT_AMBULATORY_CARE_PROVIDER_SITE_OTHER): Payer: PRIVATE HEALTH INSURANCE | Admitting: Vascular Surgery

## 2022-11-24 ENCOUNTER — Ambulatory Visit: Payer: Medicare Other | Admitting: Urology

## 2022-11-29 ENCOUNTER — Ambulatory Visit (INDEPENDENT_AMBULATORY_CARE_PROVIDER_SITE_OTHER): Payer: Medicare Other

## 2022-11-29 ENCOUNTER — Ambulatory Visit (INDEPENDENT_AMBULATORY_CARE_PROVIDER_SITE_OTHER): Payer: Medicare Other | Admitting: Nurse Practitioner

## 2022-11-29 ENCOUNTER — Encounter (INDEPENDENT_AMBULATORY_CARE_PROVIDER_SITE_OTHER): Payer: Self-pay | Admitting: Nurse Practitioner

## 2022-11-29 VITALS — BP 149/69 | HR 67 | Resp 16 | Wt 206.4 lb

## 2022-11-29 DIAGNOSIS — I771 Stricture of artery: Secondary | ICD-10-CM | POA: Diagnosis not present

## 2022-11-29 DIAGNOSIS — E782 Mixed hyperlipidemia: Secondary | ICD-10-CM | POA: Diagnosis not present

## 2022-11-29 DIAGNOSIS — I6523 Occlusion and stenosis of bilateral carotid arteries: Secondary | ICD-10-CM

## 2022-11-29 DIAGNOSIS — R040 Epistaxis: Secondary | ICD-10-CM
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
## 2023-01-05 ENCOUNTER — Encounter: Payer: Self-pay | Admitting: Dermatology

## 2023-01-05 ENCOUNTER — Ambulatory Visit: Payer: Medicare Other | Admitting: Dermatology

## 2023-01-05 VITALS — BP 161/89

## 2023-01-05 DIAGNOSIS — L578 Other skin changes due to chronic exposure to nonionizing radiation: Secondary | ICD-10-CM | POA: Diagnosis not present

## 2023-01-05 DIAGNOSIS — L820 Inflamed seborrheic keratosis: Secondary | ICD-10-CM

## 2023-01-05 DIAGNOSIS — D045 Carcinoma in situ of skin of trunk: Secondary | ICD-10-CM | POA: Diagnosis not present

## 2023-01-05 DIAGNOSIS — L821 Other seborrheic keratosis: Secondary | ICD-10-CM

## 2023-01-05 DIAGNOSIS — L814 Other melanin hyperpigmentation: Secondary | ICD-10-CM

## 2023-01-05 DIAGNOSIS — D492 Neoplasm of unspecified behavior of bone, soft tissue, and skin: Secondary | ICD-10-CM

## 2023-01-05 DIAGNOSIS — D229 Melanocytic nevi, unspecified: Secondary | ICD-10-CM

## 2023-01-05 DIAGNOSIS — Z1283 Encounter for screening for malignant neoplasm of skin: Secondary | ICD-10-CM | POA: Diagnosis not present

## 2023-01-05 DIAGNOSIS — D1801 Hemangioma of skin and subcutaneous tissue: Secondary | ICD-10-CM

## 2023-01-05 DIAGNOSIS — C4442 Squamous cell carcinoma of skin of scalp and neck: Secondary | ICD-10-CM

## 2023-01-05 DIAGNOSIS — W908XXA Exposure to other nonionizing radiation, initial encounter: Secondary | ICD-10-CM

## 2023-01-05 DIAGNOSIS — L570 Actinic keratosis: Secondary | ICD-10-CM

## 2023-01-05 DIAGNOSIS — Z872 Personal history of diseases of the skin and subcutaneous tissue: Secondary | ICD-10-CM

## 2023-01-05 DIAGNOSIS — Z85828 Personal history of other malignant neoplasm of skin: Secondary | ICD-10-CM

## 2023-01-05 DIAGNOSIS — D099 Carcinoma in situ, unspecified: Secondary | ICD-10-CM

## 2023-01-05 HISTORY — DX: Carcinoma in situ, unspecified: D09.9

## 2023-01-05 NOTE — Patient Instructions (Addendum)

## 2023-01-05 NOTE — Progress Notes (Addendum)
Follow-Up Visit   Subjective  Michael Cantrell is a 72 y.o. male who presents for the following: Skin Cancer Screening and Full Body Skin Exam  check spots scalp, scaly, R post shoulder pt not sure how long it has been there, has gotten larger over past 43m, itchy, hx of SCCs, hx of AKs  The patient presents for Total-Body Skin Exam (TBSE) for skin cancer screening and mole check. The patient has spots, moles and lesions to be evaluated, some may be new or changing and the patient may have concern these could be cancer.    The following portions of the chart were reviewed this encounter and updated as appropriate: medications, allergies, medical history  Review of Systems:  No other skin or systemic complaints except as noted in HPI or Assessment and Plan.  Objective  Well appearing patient in no apparent distress; mood and affect are within normal limits.  A full examination was performed including scalp, head, eyes, ears, nose, lips, neck, chest, axillae, abdomen, back, buttocks, bilateral upper extremities, bilateral lower extremities, hands, feet, fingers, toes, fingernails, and toenails. All findings within normal limits unless otherwise noted below.   Relevant physical exam findings are noted in the Assessment and Plan.  R ant frontal scalp Keratotic plaque 1.0cm       Posterior frontal scalp Keratotic papule 9 mm       Right Upper Back Keratotic plaque 2.5 x 2.0 cm     L helix x 4. L forehead x 4, L temple x 1, bridge of nose x 1, R forehead x 3, central upper forehead x 1, R preauricular x 1, R sup helix x 1, R cheek x 1 (17) Pink scaly macules    Assessment & Plan   SKIN CANCER SCREENING PERFORMED TODAY.  ACTINIC DAMAGE - Chronic condition, secondary to cumulative UV/sun exposure - diffuse scaly erythematous macules with underlying dyspigmentation - Recommend daily broad spectrum sunscreen SPF 30+ to sun-exposed areas, reapply every 2 hours as needed.   - Staying in the shade or wearing long sleeves, sun glasses (UVA+UVB protection) and wide brim hats (4-inch brim around the entire circumference of the hat) are also recommended for sun protection.  - Call for new or changing lesions.  LENTIGINES, SEBORRHEIC KERATOSES, HEMANGIOMAS - Benign normal skin lesions - Benign-appearing - Call for any changes  MELANOCYTIC NEVI - Tan-brown and/or pink-flesh-colored symmetric macules and papules - Benign appearing on exam today - Observation - Call clinic for new or changing moles - Recommend daily use of broad spectrum spf 30+ sunscreen to sun-exposed areas.   HISTORY OF SQUAMOUS CELL CARCINOMA OF THE SKIN - No evidence of recurrence today - No lymphadenopathy - Recommend regular full body skin exams - Recommend daily broad spectrum sunscreen SPF 30+ to sun-exposed areas, reapply every 2 hours as needed.  - Call if any new or changing lesions are noted between office visits - L temple, R scalp   Neoplasm of skin (2) R ant frontal scalp  Skin / nail biopsy Type of biopsy: tangential   Informed consent: discussed and consent obtained   Timeout: patient name, date of birth, surgical site, and procedure verified   Procedure prep:  Patient was prepped and draped in usual sterile fashion Prep type:  Isopropyl alcohol Anesthesia: the lesion was anesthetized in a standard fashion   Anesthetic:  1% lidocaine w/ epinephrine 1-100,000 buffered w/ 8.4% NaHCO3 Instrument used: DermaBlade   Hemostasis achieved with: pressure, aluminum chloride and electrodesiccation  Outcome: patient tolerated procedure well   Post-procedure details: sterile dressing applied and wound care instructions given   Dressing type: bandage and petrolatum    Specimen 1 - Surgical pathology Differential Diagnosis: D48.5 R/O SCC  Check Margins: No Keratotic macule 1.0cm  Posterior frontal scalp  Skin / nail biopsy Type of biopsy: tangential   Informed consent:  discussed and consent obtained   Timeout: patient name, date of birth, surgical site, and procedure verified   Procedure prep:  Patient was prepped and draped in usual sterile fashion Prep type:  Isopropyl alcohol Anesthesia: the lesion was anesthetized in a standard fashion   Anesthetic:  1% lidocaine w/ epinephrine 1-100,000 buffered w/ 8.4% NaHCO3 Instrument used: DermaBlade   Hemostasis achieved with: pressure, aluminum chloride and electrodesiccation   Outcome: patient tolerated procedure well   Post-procedure details: sterile dressing applied and wound care instructions given   Dressing type: bandage and petrolatum    Specimen 2 - Surgical pathology Differential Diagnosis: D48.5 R/O SCC  Check Margins: No Keratotic macule 1.0cm  IRRITATED SEBORRHEIC KERATOSIS Right Upper Back, 25 x 20 mm keratotic pink plaque Symptomatic per patient. Patient requests removal. Offered cryotherapy vs shave removal. Patient opts for shave removal  Skin / nail biopsy Type of biopsy: shave removal Informed consent: discussed and consent obtained   Timeout: patient name, date of birth, surgical site, and procedure verified   Procedure prep:  Patient was prepped and draped in usual sterile fashion Prep type:  Isopropyl alcohol Anesthesia: the lesion was anesthetized in a standard fashion   Anesthetic:  1% lidocaine w/ epinephrine 1-100,000 buffered w/ 8.4% NaHCO3 Instrument used: DermaBlade   Hemostasis achieved with: pressure and aluminum chloride   Outcome: patient tolerated procedure well   Post-procedure details: sterile dressing applied and wound care instructions given   Dressing type: bandage and petrolatum    Specimen 3 - Surgical pathology Differential Diagnosis: D48.5 SK r/o SCC  Check Margins: No Keratotic macule 2.5 x 2.0cm  Discussed if skin cancer recommend mohs for post frontal scalp (pt prefers Dr. Adriana Simas at [facility])  AK (actinic keratosis) (17) L helix x 4. L forehead x 4, L  temple x 1, bridge of nose x 1, R forehead x 3, central upper forehead x 1, R preauricular x 1, R sup helix x 1, R cheek x 1  Actinic keratoses are precancerous spots that appear secondary to cumulative UV radiation exposure/sun exposure over time. They are chronic with expected duration over 1 year. A portion of actinic keratoses will progress to squamous cell carcinoma of the skin. It is not possible to reliably predict which spots will progress to skin cancer and so treatment is recommended to prevent development of skin cancer.  Recommend daily broad spectrum sunscreen SPF 30+ to sun-exposed areas, reapply every 2 hours as needed.  Recommend staying in the shade or wearing long sleeves, sun glasses (UVA+UVB protection) and wide brim hats (4-inch brim around the entire circumference of the hat). Call for new or changing lesions.  Destruction of lesion - L helix x 4. L forehead x 4, L temple x 1, bridge of nose x 1, R forehead x 3, central upper forehead x 1, R preauricular x 1, R sup helix x 1, R cheek x 1 (17) Complexity: simple   Destruction method: cryotherapy   Informed consent: discussed and consent obtained   Timeout:  patient name, date of birth, surgical site, and procedure verified Lesion destroyed using liquid nitrogen: Yes   Region  frozen until ice ball extended beyond lesion: Yes   Cryo cycles: 1 or 2. Outcome: patient tolerated procedure well with no complications   Post-procedure details: wound care instructions given    Related Medications fluorouracil (EFUDEX) 5 % cream Apply twice daily to temples for 7 days  Inflamed seborrheic keratosis   Return in about 6 months (around 07/06/2023) for TBSE, Hx of SCC, Hx of AKs.  I, Ardis Rowan, RMA, am acting as scribe for Elie Goody, MD .   Documentation: I have reviewed the above documentation for accuracy and completeness, and I agree with the above.  Elie Goody, MD

## 2023-01-09 LAB — SURGICAL PATHOLOGY

## 2023-01-11 ENCOUNTER — Telehealth: Payer: Self-pay

## 2023-01-11 DIAGNOSIS — C4442 Squamous cell carcinoma of skin of scalp and neck: Secondary | ICD-10-CM

## 2023-01-11 NOTE — Telephone Encounter (Signed)
Discussed pathology results. Patient voiced understanding. Mohs referral sent to Dr. Adriana Simas in [facility] for [facility] on scalp. EDC for SCC on back scheduled for 02/13/2023.

## 2023-01-11 NOTE — Telephone Encounter (Signed)
-----   Message from [facility] sent at 01/11/2023 11:23 AM EDT ----- Diagnosis:  1. Skin, right ant frontal scalp :       WELL DIFFERENTIATED SQUAMOUS CELL CARCINOMA, SEE DESCRIPTION        2. Skin, posterior frontal scalp :       SQUAMOUS CELL CARCINOMA, KERATOACANTHOMA TYPE        3. Skin, right upper back :       SQUAMOUS CELL CARCINOMA IN SITU, HYPERTROPHIC    Please call.  For sites 1 and 2 (both scalp): PATIENT WANTS MOHS AT [facility]. Please send referral to Dr Adriana Simas Explanation: These are squamous cell skin cancers that have grown beyond the surface of the skin and are invading the second layer of the skin. They have the potential to spread beyond the skin and threaten your health, so I recommend treating them.  Treatment: Given the location and type of skin cancer, I recommend Mohs surgery. Mohs surgery involves cutting out the skin cancer and then checking under the microscope to ensure the whole skin cancer was removed. If any skin cancer remains, the surgeon will cut out more until it is fully removed. The cure rate is about 98-99%. Once the Mohs surgeon confirms the skin cancer is out, they will discuss the options to repair or heal the area. You must take it easy for about two weeks after surgery (no lifting over 10-15 lbs, avoid activity to get your heart rate and blood pressure up).   For site 3 (right upper back): Explanation: This is a squamous cell skin cancer limited to the top layer of skin. This means it is an early cancer and has not spread. However, it has the potential to spread beyond the skin and threaten your health, so we recommend treating it.  Treatment: you return for a brief appointment where I perform electrodesiccation and curettage [facility]). This involves three rounds of scraping and burning to destroy the skin cancer. It has about an 85% cure rate and leaves a round wound slightly larger than the skin cancer and leaves a round white scar. No additional  pathology is done. If the skin cancer comes back, we would need to do a surgery to remove it. PLEASE SCHEDULE in 6 weeks

## 2023-01-26 ENCOUNTER — Encounter: Payer: Self-pay | Admitting: Urology

## 2023-01-26 ENCOUNTER — Ambulatory Visit: Payer: Medicare Other | Admitting: Urology

## 2023-01-26 VITALS — BP 149/65 | HR 106 | Ht 67.0 in | Wt 202.0 lb

## 2023-01-26 DIAGNOSIS — N390 Urinary tract infection, site not specified: Secondary | ICD-10-CM

## 2023-01-26 DIAGNOSIS — Z87440 Personal history of urinary (tract) infections: Secondary | ICD-10-CM | POA: Diagnosis not present

## 2023-01-26 DIAGNOSIS — N393 Stress incontinence (female) (male): Secondary | ICD-10-CM

## 2023-01-26 DIAGNOSIS — C61 Malignant neoplasm of prostate: Secondary | ICD-10-CM

## 2023-01-26 DIAGNOSIS — Z8546 Personal history of malignant neoplasm of prostate: Secondary | ICD-10-CM

## 2023-01-26 LAB — BLADDER SCAN AMB NON-IMAGING

## 2023-01-26 MED ORDER — BICALUTAMIDE 50 MG PO TABS: 50.0000 mg | ORAL_TABLET | Freq: Every day | ORAL | 3 refills | Status: DC

## 2023-01-26 MED ORDER — FINASTERIDE 5 MG PO TABS: 5.0000 mg | ORAL_TABLET | Freq: Every day | ORAL | 3 refills | Status: DC

## 2023-01-26 NOTE — Progress Notes (Signed)
01/26/2023 11:46 AM   Michael Cantrell 12-17-1950 [id number]  Reason for visit: Follow up history of prostate cancer, reported biochemical recurrence, stress incontinence, atrophic kidney, rUTI  HPI: 72 year-old very co-morbid male who I inherited from Dr. Dahlstedt([facility]) in February 2023. He has a history of a perineal prostatectomy in 2000 for prostate cancer, and reportedly underwent radiation as well.  Grade and stage of prostate cancer at time of diagnosis unknown.  He apparently has been on Casodex and finasteride for biochemical recurrence from Dr. Retta Diones. He reportedly stopped these medications a few years ago and his PSA increased to 18.    PSA was 0.2 in January 2023, and 0.4 in August 2023-however he was found to have a culture positive UTI at that time and did report some cloudy and mucousy urine with some mild dysuria and was treated with a 2-week course of Bactrim. PSA from 05/23/2022 is stable at 0.3, and he remains on Casodex and finasteride.  PSA today is pending  He denies any UTIs since his last visit.  His only urinary complaint is stress incontinence.  I recommended Kegel exercises and considering a Cunningham clamp.  In terms of his atrophic kidney, most recent imaging with NM renal scan from August 2021 showed 93% function of the left kidney with only 7% function on the right kidney.  The etiology of his atrophic kidney is unclear, his CT from 2015 reportedly showed normal appearing kidneys bilaterally.  Renal function is normal, with most recent creatinine of 0.9, EGFR greater than 60.  Casodex and finasteride refilled PSA today, call with results If PSA stable, continue yearly PSA monitoring, sooner if problems-> if recurrent infections could consider CT urogram/cystoscopy for further evaluation  Sondra Come, MD  [facility] [address], [address] [place], [place] [address] [phone]

## 2023-01-26 NOTE — Patient Instructions (Signed)
You can try a Cunningham clamp or Wiesner clamp for your urinary leakage.  This can be purchased from a medical supply store, or online at [email or web address].  Kegel Exercises  Kegel exercises can help strengthen your pelvic floor muscles. The pelvic floor is a group of muscles that support your rectum, small intestine, and bladder. In females, pelvic floor muscles also help support the uterus. These muscles help you control the flow of urine and stool (feces). Kegel exercises are painless and simple. They do not require any equipment. Your provider may suggest Kegel exercises to: Improve bladder and bowel control. Improve sexual response. Improve weak pelvic floor muscles after surgery to remove the uterus (hysterectomy) or after pregnancy, in females. Improve weak pelvic floor muscles after prostate gland removal or surgery, in males. Kegel exercises involve squeezing your pelvic floor muscles. These are the same muscles you squeeze when you try to stop the flow of urine or keep from passing gas. The exercises can be done while sitting, standing, or lying down, but it is best to vary your position. Ask your health care provider which exercises are safe for you. Do exercises exactly as told by your health care provider and adjust them as directed. Do not begin these exercises until told by your health care provider. Exercises How to do Kegel exercises: Squeeze your pelvic floor muscles tight. You should feel a tight lift in your rectal area. If you are a male, you should also feel a tightness in your vaginal area. Keep your stomach, buttocks, and legs relaxed. Hold the muscles tight for up to 10 seconds. Breathe normally. Relax your muscles for up to 10 seconds. Repeat as told by your health care provider. Repeat this exercise daily as told by your health care provider. Continue to do this exercise for at least 4-6 weeks, or for as long as told by your health care provider. You may be referred to a  physical therapist who can help you learn more about how to do Kegel exercises. Depending on your condition, your health care provider may recommend: Varying how long you squeeze your muscles. Doing several sets of exercises every day. Doing exercises for several weeks. Making Kegel exercises a part of your regular exercise routine. This information is not intended to replace advice given to you by your health care provider. Make sure you discuss any questions you have with your health care provider. Document Revised: 07/23/2020 Document Reviewed: 07/23/2020 Elsevier Patient Education  2024 [employer].

## 2023-01-27 ENCOUNTER — Telehealth: Payer: Self-pay

## 2023-01-27 LAB — PSA: Prostate Specific Ag, Serum: 0.3 ng/mL (ref 0.0–4.0)

## 2023-01-27 NOTE — Telephone Encounter (Signed)
-----   Message from Sondra Come sent at 01/27/2023  6:54 AM EDT ----- Peri [facility] news, PSA stable at 0.3, follow-up in 1 year with PSA prior  Legrand Rams, MD 01/27/2023

## 2023-01-27 NOTE — Telephone Encounter (Signed)
Called pt informed him of the information below, pt voiced understanding. PSA ordered. Appt scheduled.

## 2023-02-13 ENCOUNTER — Encounter: Payer: Medicare Other | Admitting: Dermatology

## 2023-04-04 ENCOUNTER — Telehealth: Payer: Self-pay

## 2023-04-04 NOTE — Telephone Encounter (Signed)
 Specimen tracking and history updated from [facility] of right medial frontal scalp completed 03/15/23. aw

## 2023-07-10 ENCOUNTER — Ambulatory Visit: Payer: Medicare Other | Admitting: Dermatology

## 2023-08-23 ENCOUNTER — Telehealth: Payer: Self-pay

## 2023-08-23 NOTE — Telephone Encounter (Signed)
 Specimen tracking and history updated from [facility] on right upper back. aw

## 2023-09-04 ENCOUNTER — Ambulatory Visit: Admitting: Dermatology

## 2023-09-04 ENCOUNTER — Encounter: Payer: Self-pay | Admitting: Dermatology

## 2023-09-04 DIAGNOSIS — D0439 Carcinoma in situ of skin of other parts of face: Secondary | ICD-10-CM

## 2023-09-04 DIAGNOSIS — Z1283 Encounter for screening for malignant neoplasm of skin: Secondary | ICD-10-CM

## 2023-09-04 DIAGNOSIS — D492 Neoplasm of unspecified behavior of bone, soft tissue, and skin: Secondary | ICD-10-CM

## 2023-09-04 DIAGNOSIS — D044 Carcinoma in situ of skin of scalp and neck: Secondary | ICD-10-CM | POA: Diagnosis not present

## 2023-09-04 DIAGNOSIS — L570 Actinic keratosis: Secondary | ICD-10-CM

## 2023-09-04 DIAGNOSIS — S50812A Abrasion of left forearm, initial encounter: Secondary | ICD-10-CM

## 2023-09-04 DIAGNOSIS — D1801 Hemangioma of skin and subcutaneous tissue: Secondary | ICD-10-CM

## 2023-09-04 DIAGNOSIS — D229 Melanocytic nevi, unspecified: Secondary | ICD-10-CM

## 2023-09-04 DIAGNOSIS — L821 Other seborrheic keratosis: Secondary | ICD-10-CM

## 2023-09-04 DIAGNOSIS — D692 Other nonthrombocytopenic purpura: Secondary | ICD-10-CM

## 2023-09-04 DIAGNOSIS — W908XXA Exposure to other nonionizing radiation, initial encounter: Secondary | ICD-10-CM

## 2023-09-04 DIAGNOSIS — L578 Other skin changes due to chronic exposure to nonionizing radiation: Secondary | ICD-10-CM

## 2023-09-04 DIAGNOSIS — Z85828 Personal history of other malignant neoplasm of skin: Secondary | ICD-10-CM

## 2023-09-04 DIAGNOSIS — L814 Other melanin hyperpigmentation: Secondary | ICD-10-CM | POA: Diagnosis not present

## 2023-09-04 DIAGNOSIS — D099 Carcinoma in situ, unspecified: Secondary | ICD-10-CM

## 2023-09-04 HISTORY — DX: Carcinoma in situ, unspecified: D09.9

## 2023-09-04 NOTE — Progress Notes (Signed)
 Follow-Up Visit   Subjective  Michael Cantrell is a 73 y.o. male who presents for the following: Skin Cancer Screening and Full Body Skin Exam. Patient with hx of SCC. Patient reports places at scalp x 1 month, not sore just itchy.   The patient presents for Total-Body Skin Exam (TBSE) for skin cancer screening and mole check. The patient has spots, moles and lesions to be evaluated, some may be new or changing and the patient may have concern these could be cancer.   The following portions of the chart were reviewed this encounter and updated as appropriate: medications, allergies, medical history  Review of Systems:  No other skin or systemic complaints except as noted in HPI or Assessment and Plan.  Objective  Well appearing patient in no apparent distress; mood and affect are within normal limits.  A full examination was performed including scalp, head, eyes, ears, nose, lips, neck, chest, axillae, abdomen, back, buttocks, bilateral upper extremities, bilateral lower extremities, hands, feet, fingers, toes, fingernails, and toenails. All findings within normal limits unless otherwise noted below.   Relevant physical exam findings are noted in the Assessment and Plan.  Posterior vertex 1.9cm pink scaly plaque  R parietal lateral scalp 1.2cm keratotic plaque  Medial scalp 8mm cutaneous horn  Right zygoma 5mm pink scaly papule  R sup helix x1, scalp x12, L sup helix 1, L temple x1 (15) Pink scaly macules  Assessment & Plan   SKIN CANCER SCREENING PERFORMED TODAY.  ACTINIC DAMAGE - Chronic condition, secondary to cumulative UV/sun exposure - diffuse scaly erythematous macules with underlying dyspigmentation - Recommend daily broad spectrum sunscreen SPF 30+ to sun-exposed areas, reapply every 2 hours as needed.  - Staying in the shade or wearing long sleeves, sun glasses (UVA+UVB protection) and wide brim hats (4-inch brim around the entire circumference of the hat) are  also recommended for sun protection.  - Call for new or changing lesions.  LENTIGINES, SEBORRHEIC KERATOSES, HEMANGIOMAS - Benign normal skin lesions - Benign-appearing - Call for any changes  MELANOCYTIC NEVI - Tan-brown and/or pink-flesh-colored symmetric macules and papules - Benign appearing on exam today - Observation - Call clinic for new or changing moles - Recommend daily use of broad spectrum spf 30+ sunscreen to sun-exposed areas.   HISTORY OF SQUAMOUS CELL CARCINOMA OF THE SKIN Multiple see history - No evidence of recurrence today - No lymphadenopathy - Recommend regular full body skin exams - Recommend daily broad spectrum sunscreen SPF 30+ to sun-exposed areas, reapply every 2 hours as needed.  - Call if any new or changing lesions are noted between office visits  EXCORIATION Exam: Excoriation at L forearm   Treatment Plan: Recommend vaseline. Call if not resolving.   NEOPLASM OF SKIN (4) Posterior vertex Skin / nail biopsy Type of biopsy: tangential   Informed consent: discussed and consent obtained   Timeout: patient name, date of birth, surgical site, and procedure verified   Procedure prep:  Patient was prepped and draped in usual sterile fashion Prep type:  Isopropyl alcohol Anesthesia: the lesion was anesthetized in a standard fashion   Anesthetic:  1% lidocaine  w/ epinephrine  1-100,000 buffered w/ 8.4% NaHCO3 Instrument used: DermaBlade   Hemostasis achieved with: pressure and aluminum chloride   Outcome: patient tolerated procedure well   Post-procedure details: sterile dressing applied and wound care instructions given   Dressing type: bandage and petrolatum   Specimen 1 - Surgical pathology Differential Diagnosis: SCC  Check Margins: No 1.9cm pink  scaly plaque R parietal lateral scalp Skin / nail biopsy Type of biopsy: tangential   Informed consent: discussed and consent obtained   Timeout: patient name, date of birth, surgical site, and  procedure verified   Procedure prep:  Patient was prepped and draped in usual sterile fashion Prep type:  Isopropyl alcohol Anesthesia: the lesion was anesthetized in a standard fashion   Anesthetic:  1% lidocaine  w/ epinephrine  1-100,000 buffered w/ 8.4% NaHCO3 Instrument used: DermaBlade   Hemostasis achieved with: pressure and aluminum chloride   Outcome: patient tolerated procedure well   Post-procedure details: sterile dressing applied and wound care instructions given   Dressing type: bandage and petrolatum   Specimen 2 - Surgical pathology Differential Diagnosis: SCC  Check Margins: No 1.2cm keratotic plaque Medial scalp Skin / nail biopsy Type of biopsy: tangential   Informed consent: discussed and consent obtained   Timeout: patient name, date of birth, surgical site, and procedure verified   Procedure prep:  Patient was prepped and draped in usual sterile fashion Prep type:  Isopropyl alcohol Anesthesia: the lesion was anesthetized in a standard fashion   Anesthetic:  1% lidocaine  w/ epinephrine  1-100,000 buffered w/ 8.4% NaHCO3 Instrument used: DermaBlade   Hemostasis achieved with: pressure and aluminum chloride   Outcome: patient tolerated procedure well   Post-procedure details: sterile dressing applied and wound care instructions given   Dressing type: bandage and petrolatum   Specimen 3 - Surgical pathology Differential Diagnosis: SCC  Check Margins: No 8mm cutaneous form Right zygoma Skin / nail biopsy Type of biopsy: tangential   Informed consent: discussed and consent obtained   Timeout: patient name, date of birth, surgical site, and procedure verified   Procedure prep:  Patient was prepped and draped in usual sterile fashion Prep type:  Isopropyl alcohol Anesthesia: the lesion was anesthetized in a standard fashion   Anesthetic:  1% lidocaine  w/ epinephrine  1-100,000 buffered w/ 8.4% NaHCO3 Instrument used: DermaBlade   Hemostasis achieved with:  pressure and aluminum chloride   Outcome: patient tolerated procedure well   Post-procedure details: sterile dressing applied and wound care instructions given   Dressing type: bandage and petrolatum   Specimen 4 - Surgical pathology Differential Diagnosis: SCC  Check Margins: No 5mm pink scaly papule AK (ACTINIC KERATOSIS) (15) R sup helix x1, scalp x12, L sup helix 1, L temple x1 (15) Actinic keratoses are precancerous spots that appear secondary to cumulative UV radiation exposure/sun exposure over time. They are chronic with expected duration over 1 year. A portion of actinic keratoses will progress to squamous cell carcinoma of the skin. It is not possible to reliably predict which spots will progress to skin cancer and so treatment is recommended to prevent development of skin cancer.  Recommend daily broad spectrum sunscreen SPF 30+ to sun-exposed areas, reapply every 2 hours as needed.  Recommend staying in the shade or wearing long sleeves, sun glasses (UVA+UVB protection) and wide brim hats (4-inch brim around the entire circumference of the hat). Call for new or changing lesions. Destruction of lesion - R sup helix x1, scalp x12, L sup helix 1, L temple x1 (15) Complexity: simple   Destruction method: cryotherapy   Informed consent: discussed and consent obtained   Timeout:  patient name, date of birth, surgical site, and procedure verified Lesion destroyed using liquid nitrogen: Yes   Region frozen until ice ball extended beyond lesion: Yes   Cryo cycles: 1 or 2. Outcome: patient tolerated procedure well with no complications  Post-procedure details: wound care instructions given   MULTIPLE BENIGN NEVI   LENTIGINES   SEBORRHEIC KERATOSES   ACTINIC ELASTOSIS   SOLAR PURPURA (HCC)   CHERRY ANGIOMA   Return in about 6 months (around 03/05/2024) for w/ Dr. Felipe Horton, HxSCC, HxAK, TBSE.  I, Jacquelynn V. Grier Leber, CMA, am acting as scribe for Harris Liming, MD  .   Documentation: I have reviewed the above documentation for accuracy and completeness, and I agree with the above.  Harris Liming, MD

## 2023-09-04 NOTE — Patient Instructions (Addendum)
 Wound Care Instructions  Cleanse wound gently with soap and water once a day then pat dry with clean gauze. Apply a thin coat of Petrolatum (petroleum jelly, "Vaseline") over the wound (unless you have an allergy to this). We recommend that you use a new, sterile tube of Vaseline. Do not pick or remove scabs. Do not remove the yellow or white "healing tissue" from the base of the wound.  Cover the wound with fresh, clean, nonstick gauze and secure with paper tape. You may use Band-Aids in place of gauze and tape if the wound is small enough, but would recommend trimming much of the tape off as there is often too much. Sometimes Band-Aids can irritate the skin.  You should call the office for your biopsy report after 1 week if you have not already been contacted.  If you experience any problems, such as abnormal amounts of bleeding, swelling, significant bruising, significant pain, or evidence of infection, please call the office immediately.  FOR ADULT SURGERY PATIENTS: If you need something for pain relief you may take 1 extra strength Tylenol (acetaminophen) AND 2 Ibuprofen (200mg  each) together every 4 hours as needed for pain. (do not take these if you are allergic to them or if you have a reason you should not take them.) Typically, you may only need pain medication for 1 to 3 days.       Recommend daily broad spectrum sunscreen SPF 30+ to sun-exposed areas, reapply every 2 hours as needed. Call for new or changing lesions.  Staying in the shade or wearing long sleeves, sun glasses (UVA+UVB protection) and wide brim hats (4-inch brim around the entire circumference of the hat) are also recommended for sun protection.    Melanoma ABCDEs  Melanoma is the most dangerous type of skin cancer, and is the leading cause of death from skin disease.  You are more likely to develop melanoma if you: Have light-colored skin, light-colored eyes, or red or blond hair Spend a lot of time in the sun Tan  regularly, either outdoors or in a tanning bed Have had blistering sunburns, especially during childhood Have a close family member who has had a melanoma Have atypical moles or large birthmarks  Early detection of melanoma is key since treatment is typically straightforward and cure rates are extremely high if we catch it early.   The first sign of melanoma is often a change in a mole or a new dark spot.  The ABCDE system is a way of remembering the signs of melanoma.  A for asymmetry:  The two halves do not match. B for border:  The edges of the growth are irregular. C for color:  A mixture of colors are present instead of an even brown color. D for diameter:  Melanomas are usually (but not always) greater than 6mm - the size of a pencil eraser. E for evolution:  The spot keeps changing in size, shape, and color.  Please check your skin once per month between visits. You can use a small mirror in front and a large mirror behind you to keep an eye on the back side or your body.   If you see any new or changing lesions before your next follow-up, please call to schedule a visit.  Please continue daily skin protection including broad spectrum sunscreen SPF 30+ to sun-exposed areas, reapplying every 2 hours as needed when you're outdoors.   Staying in the shade or wearing long sleeves, sun glasses (UVA+UVB protection) and  wide brim hats (4-inch brim around the entire circumference of the hat) are also recommended for sun protection.     Due to recent changes in healthcare laws, you may see results of your pathology and/or laboratory studies on MyChart before the doctors have had a chance to review them. We understand that in some cases there may be results that are confusing or concerning to you. Please understand that not all results are received at the same time and often the doctors may need to interpret multiple results in order to provide you with the best plan of care or course of  treatment. Therefore, we ask that you please give [place] 2 business days to thoroughly review all your results before contacting the office for clarification. Should we see a critical lab result, you will be contacted sooner.   If You Need Anything After Your Visit  If you have any questions or concerns for your doctor, please call our main line at [phone] and press option 4 to reach your doctor's medical assistant. If no one answers, please leave a voicemail as directed and we will return your call as soon as possible. Messages left after 4 pm will be answered the following business day.   You may also send [place] a message via MyChart. We typically respond to MyChart messages within 1-2 business days.  For prescription refills, please ask your pharmacy to contact our office. Our fax number is [phone].  If you have an urgent issue when the clinic is closed that cannot wait until the next business day, you can page your doctor at the number below.    Please note that while we do our best to be available for urgent issues outside of office hours, we are not available 24/7.   If you have an urgent issue and are unable to reach [place], you may choose to seek medical care at your doctor's office, retail clinic, urgent care center, or emergency room.  If you have a medical emergency, please immediately call 911 or go to the emergency department.  Pager Numbers  - Dr. Gwen Pounds: [phone]  - Dr. Roseanne Reno: [phone]  - Dr. Katrinka Blazing: [phone]   In the event of inclement weather, please call our main line at [phone] for an update on the status of any delays or closures.  Dermatology Medication Tips: Please keep the boxes that topical medications come in in order to help keep track of the instructions about where and how to use these. Pharmacies typically print the medication instructions only on the boxes and not directly on the medication tubes.   If your medication is too expensive,  please contact our office at [phone] option 4 or send [place] a message through MyChart.   We are unable to tell what your co-pay for medications will be in advance as this is different depending on your insurance coverage. However, we may be able to find a substitute medication at lower cost or fill out paperwork to get insurance to cover a needed medication.   If a prior authorization is required to get your medication covered by your insurance company, please allow [place] 1-2 business days to complete this process.  Drug prices often vary depending on where the prescription is filled and some pharmacies may offer cheaper prices.  The website www.goodrx.com contains coupons for medications through different pharmacies. The prices here do not account for what the cost may be with help from insurance (it may be cheaper with your insurance), but the website  can give you the price if you did not use any insurance.  - You can print the associated coupon and take it with your prescription to the pharmacy.  - You may also stop by our office during regular business hours and pick up a GoodRx coupon card.  - If you need your prescription sent electronically to a different pharmacy, notify our office through [facility] or by phone at [phone] option 4.     Si Usted Necesita Algo Despus de Su Visita  Tambin puede enviarnos un mensaje a travs de Clinical cytogeneticist. Por lo general respondemos a los mensajes de MyChart en el transcurso de 1 a 2 das hbiles.  Para renovar recetas, por favor pida a su farmacia que se ponga en contacto con nuestra oficina. Annie Sable de fax es [place] [phone].  Si tiene un asunto urgente cuando la clnica est cerrada y que no puede esperar hasta el siguiente da hbil, puede llamar/localizar a su doctor(a) al nmero que aparece a continuacin.   Por favor, tenga en cuenta que aunque hacemos todo lo posible para estar disponibles para asuntos urgentes fuera del  horario de [employer], no estamos disponibles las 24 horas del da, los 7 [address]  [address] de la [place].   Si tiene un problema urgente y no puede comunicarse con nosotros, puede optar por buscar atencin mdica  en el consultorio de su doctor(a), en una clnica privada, en un centro de atencin urgente o en una sala de emergencias.  Si tiene Engineer, drilling, por favor llame inmediatamente al 911 o vaya a la sala de emergencias.  Nmeros de bper  - Dr. Gwen Pounds: [phone]  - Dra. Roseanne Reno: [phone]  - Dr. Katrinka Blazing: [phone]   En caso de inclemencias del tiempo, por favor llame a Lacy Duverney principal al [phone] para una actualizacin sobre el [place] de cualquier retraso o cierre.  Consejos para la medicacin en dermatologa: Por favor, guarde las cajas en las que vienen los medicamentos de uso tpico para ayudarle a seguir las instrucciones sobre dnde y cmo usarlos. Las farmacias generalmente imprimen las instrucciones del medicamento slo en las cajas y no directamente en los tubos del [place].   Si su medicamento es muy caro, por favor, pngase en contacto con Rolm Gala llamando al [phone] y presione la opcin 4 o envenos un mensaje a travs de Clinical cytogeneticist.   No podemos decirle cul ser su copago por los medicamentos por adelantado ya que esto es diferente dependiendo de la cobertura de su seguro. Sin embargo, es posible que podamos encontrar un medicamento sustituto a Audiological scientist un formulario para que el seguro cubra el medicamento que se considera necesario.   Si se requiere una autorizacin previa para que su compaa de seguros [place] su medicamento, por favor permtanos de 1 a 2 das hbiles para completar [address]  Los precios de los medicamentos varan con frecuencia dependiendo del Environmental consultant de dnde se surte la receta y alguna farmacias pueden ofrecer precios ms baratos.  El sitio web www.goodrx.com tiene cupones para medicamentos de Engineer, civil (consulting). Los precios aqu no tienen en cuenta lo que podra costar con la ayuda del seguro (puede ser ms barato con su seguro), pero el sitio web puede darle el precio si no utiliz Tourist information centre manager.  - Puede imprimir el cupn correspondiente y llevarlo con su receta a la farmacia.  - Tambin puede pasar por nuestra oficina durante el horario de atencin regular y Education officer, museum una tarjeta de cupones de GoodRx.  -  Si necesita que su receta se enve electrnicamente a Psychiatrist, informe a nuestra oficina a travs de MyChart de [place] o por telfono llamando al [phone] y presione la opcin 4.

## 2023-09-07 LAB — SURGICAL PATHOLOGY

## 2023-09-11 ENCOUNTER — Ambulatory Visit: Payer: Self-pay | Admitting: Dermatology

## 2023-09-12 ENCOUNTER — Encounter: Payer: Self-pay | Admitting: Dermatology

## 2023-09-12 NOTE — Telephone Encounter (Signed)
-----   Message from [facility] sent at 09/11/2023  1:40 PM EDT ----- Diagnosis 1. Skin, posterior vertex :       SQUAMOUS CELL CARCINOMA IN SITU, PERIPHERAL MARGIN INVOLVED        2. Skin, R parietal lateral scalp :       HYPERTROPHIC ACTINIC KERATOSIS, PERIPHERAL AND DEEP MARGINS INVOLVED        3. Skin, medial scalp :       SQUAMOUS CELL CARCINOMA IN SITU, PERIPHERAL MARGIN INVOLVED        4. Skin, right zygoma :       SQUAMOUS CELL CARCINOMA IN SITU, PERIPHERAL MARGIN INVOLVED    Please call with diagnosis and message me with patient's decision on treatment.  Posterior vertex  3. R parietal medial scalp (TWO SITES)  Explanation: Biopsies show squamous cell skin cancers limited to the top layer of skin. This means they are early cancers and have not spread. However, they have the potential to spread beyond the  skin and threaten your health, so we recommend treating them.   Treatment option 1: a cream (fluorouracil  and calcipotriene) that helps your immune system clear the skin cancer. It will cause redness and irritation. Wait two weeks after the biopsy to start  applying the cream. Apply the cream twice per day until the redness and irritation develop (usually occurs by day 7), then stop and allow it to heal. We will recheck the area in 2 months to ensure  the cancer is gone. The cream is $45 plus shipping and will be mailed to you from a low cost compounding pharmacy.  Treatment option 2: you return for a brief appointment where I perform electrodesiccation and curettage [facility]). This involves three rounds of scraping and burning to destroy the skin cancer. It has  about an 85% cure rate and leaves a round wound slightly larger than the skin cancer and leaves a round white scar. No additional pathology is done. If the skin cancer comes back, we would need to do  a surgery to remove it.  Treatment option 3: Mohs surgery, which involves cutting out right around the skin cancer  and then checking under the microscope on the same day to ensure the whole skin cancer is out. If there is  more cancer remaining, the surgeon will repeat the process until it is fully cleared. The cure rate is about 98-99%. It is done at another office outside of [place] ([place], [place], or  [place]). Once the Mohs surgeon confirms the skin cancer is out, they will discuss the options to repair or heal the area. You must take it easy for about two weeks after surgery (no lifting over  10-15 lbs, avoid activity to get your heart rate and blood pressure up).  2. R parietal lateral scalp: biopsy shows a precancer. Recheck in 3 months   4. Right zygoma: biopsy shows squamous skin cancer in top layer of skin  Treatment option 1: 5FU calcipotriene  Treatment option 2: Mohs ----- Message ----- From: Interface, Lab In Three Zero One Sent: 09/07/2023   4:45 PM EDT To: Harris Liming, MD

## 2023-09-13 ENCOUNTER — Encounter: Payer: Self-pay | Admitting: Dermatology

## 2023-09-13 NOTE — Telephone Encounter (Signed)
 Reviewed pathology and treatment options with patient. Patient has 5FU/calcipotriene and would like to treat topically with that. Advised patient to use to aa twice daily until red and irritated (about 7 days). Scheduled patient for 3 month follow up.  Sue Em., RMA

## 2023-09-13 NOTE — Telephone Encounter (Signed)
-----   Message from [facility] sent at 09/11/2023  1:40 PM EDT ----- Diagnosis 1. Skin, posterior vertex :       SQUAMOUS CELL CARCINOMA IN SITU, PERIPHERAL MARGIN INVOLVED        2. Skin, R parietal lateral scalp :       HYPERTROPHIC ACTINIC KERATOSIS, PERIPHERAL AND DEEP MARGINS INVOLVED        3. Skin, medial scalp :       SQUAMOUS CELL CARCINOMA IN SITU, PERIPHERAL MARGIN INVOLVED        4. Skin, right zygoma :       SQUAMOUS CELL CARCINOMA IN SITU, PERIPHERAL MARGIN INVOLVED    Please call with diagnosis and message me with patient's decision on treatment.  Posterior vertex  3. R parietal medial scalp (TWO SITES)  Explanation: Biopsies show squamous cell skin cancers limited to the top layer of skin. This means they are early cancers and have not spread. However, they have the potential to spread beyond the  skin and threaten your health, so we recommend treating them.   Treatment option 1: a cream (fluorouracil  and calcipotriene) that helps your immune system clear the skin cancer. It will cause redness and irritation. Wait two weeks after the biopsy to start  applying the cream. Apply the cream twice per day until the redness and irritation develop (usually occurs by day 7), then stop and allow it to heal. We will recheck the area in 2 months to ensure  the cancer is gone. The cream is $45 plus shipping and will be mailed to you from a low cost compounding pharmacy.  Treatment option 2: you return for a brief appointment where I perform electrodesiccation and curettage [facility]). This involves three rounds of scraping and burning to destroy the skin cancer. It has  about an 85% cure rate and leaves a round wound slightly larger than the skin cancer and leaves a round white scar. No additional pathology is done. If the skin cancer comes back, we would need to do  a surgery to remove it.  Treatment option 3: Mohs surgery, which involves cutting out right around the skin cancer  and then checking under the microscope on the same day to ensure the whole skin cancer is out. If there is  more cancer remaining, the surgeon will repeat the process until it is fully cleared. The cure rate is about 98-99%. It is done at another office outside of [place] ([place], [place], or  [place]). Once the Mohs surgeon confirms the skin cancer is out, they will discuss the options to repair or heal the area. You must take it easy for about two weeks after surgery (no lifting over  10-15 lbs, avoid activity to get your heart rate and blood pressure up).  2. R parietal lateral scalp: biopsy shows a precancer. Recheck in 3 months   4. Right zygoma: biopsy shows squamous skin cancer in top layer of skin  Treatment option 1: 5FU calcipotriene  Treatment option 2: Mohs ----- Message ----- From: Interface, Lab In Three Zero One Sent: 09/07/2023   4:45 PM EDT To: Harris Liming, MD

## 2023-11-02 ENCOUNTER — Encounter: Payer: Self-pay | Admitting: Urology

## 2023-12-05 ENCOUNTER — Other Ambulatory Visit (INDEPENDENT_AMBULATORY_CARE_PROVIDER_SITE_OTHER): Payer: Self-pay | Admitting: Vascular Surgery

## 2023-12-05 DIAGNOSIS — I6523 Occlusion and stenosis of bilateral carotid arteries: Secondary | ICD-10-CM
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
## 2023-12-07 ENCOUNTER — Encounter (INDEPENDENT_AMBULATORY_CARE_PROVIDER_SITE_OTHER): Payer: Self-pay | Admitting: Vascular Surgery

## 2023-12-07 ENCOUNTER — Ambulatory Visit (INDEPENDENT_AMBULATORY_CARE_PROVIDER_SITE_OTHER): Payer: PRIVATE HEALTH INSURANCE | Admitting: Vascular Surgery

## 2023-12-07 ENCOUNTER — Other Ambulatory Visit (INDEPENDENT_AMBULATORY_CARE_PROVIDER_SITE_OTHER): Payer: Medicare Other

## 2023-12-07 VITALS — BP 146/76 | HR 78 | Ht 67.0 in | Wt 218.0 lb

## 2023-12-07 DIAGNOSIS — I6523 Occlusion and stenosis of bilateral carotid arteries: Secondary | ICD-10-CM | POA: Diagnosis not present

## 2023-12-07 DIAGNOSIS — I10 Essential (primary) hypertension: Secondary | ICD-10-CM

## 2023-12-07 DIAGNOSIS — I771 Stricture of artery: Secondary | ICD-10-CM | POA: Diagnosis not present

## 2023-12-07 DIAGNOSIS — I872 Venous insufficiency (chronic) (peripheral): Secondary | ICD-10-CM | POA: Diagnosis not present

## 2023-12-07 DIAGNOSIS — I739 Peripheral vascular disease, unspecified: Secondary | ICD-10-CM

## 2023-12-10 ENCOUNTER — Encounter (INDEPENDENT_AMBULATORY_CARE_PROVIDER_SITE_OTHER): Payer: Self-pay | Admitting: Vascular Surgery

## 2023-12-14 ENCOUNTER — Encounter: Payer: Self-pay | Admitting: Dermatology

## 2023-12-14 ENCOUNTER — Ambulatory Visit: Admitting: Dermatology

## 2023-12-14 DIAGNOSIS — W908XXA Exposure to other nonionizing radiation, initial encounter: Secondary | ICD-10-CM | POA: Diagnosis not present

## 2023-12-14 DIAGNOSIS — L910 Hypertrophic scar: Secondary | ICD-10-CM

## 2023-12-14 DIAGNOSIS — L570 Actinic keratosis: Secondary | ICD-10-CM | POA: Diagnosis not present

## 2023-12-14 DIAGNOSIS — L578 Other skin changes due to chronic exposure to nonionizing radiation: Secondary | ICD-10-CM

## 2023-12-14 DIAGNOSIS — Z86007 Personal history of in-situ neoplasm of skin: Secondary | ICD-10-CM | POA: Diagnosis not present

## 2023-12-14 NOTE — Progress Notes (Signed)
   Follow-Up Visit   Subjective  Michael Cantrell is a 73 y.o. male who presents for the following: recheck SCCis x3 on scalp and right zygoma. Recheck hypertrophic AK at right parietal lateral scalp.  Tx SCCs with 5FU/Calcipotriene as directed. States areas are still scaly.    The following portions of the chart were reviewed this encounter and updated as appropriate: medications, allergies, medical history  Review of Systems:  No other skin or systemic complaints except as noted in HPI or Assessment and Plan.  Objective  Well appearing patient in no apparent distress; mood and affect are within normal limits.  A focused examination was performed of the following areas: Scalp, face  Relevant exam findings are noted in the Assessment and Plan.  Scalp x10, R post helix x1, L temple x3 (14) Erythematous thin papules/macules with gritty scale.   Assessment & Plan   HISTORY OF SQUAMOUS CELL CARCINOMA IN SITU OF THE SKIN - No evidence of recurrence today. R zygoma clear. - Recommend regular full body skin exams - Recommend daily broad spectrum sunscreen SPF 30+ to sun-exposed areas, reapply every 2 hours as needed.  - Call if any new or changing lesions are noted between office visits  HYPERTROPHIC SCAR, Mohs for hypertrophic SCCis Exam: thickened scar at R upper back  Benign-appearing.  Call clinic for new or changing lesions. Discussed option of intralesional triamcinolone if this becomes bothersome.   Treatment Plan: Observe.   SCCis x2, biopsy proven, persistent after partial treatment with 5FU  Exam: erythema and scale at posterior vertex scalp and medial scalp   Treatment:  EDC at next visit.   AK (ACTINIC KERATOSIS) (14) Scalp x10, R post helix x1, L temple x3 (14) Actinic keratoses are precancerous spots that appear secondary to cumulative UV radiation exposure/sun exposure over time. They are chronic with expected duration over 1 year. A portion of actinic keratoses  will progress to squamous cell carcinoma of the skin. It is not possible to reliably predict which spots will progress to skin cancer and so treatment is recommended to prevent development of skin cancer.  Recommend daily broad spectrum sunscreen SPF 30+ to sun-exposed areas, reapply every 2 hours as needed.  Recommend staying in the shade or wearing long sleeves, sun glasses (UVA+UVB protection) and wide brim hats (4-inch brim around the entire circumference of the hat). Call for new or changing lesions. Destruction of lesion - Scalp x10, R post helix x1, L temple x3 (14) Complexity: simple   Destruction method: cryotherapy   Informed consent: discussed and consent obtained   Timeout:  patient name, date of birth, surgical site, and procedure verified Lesion destroyed using liquid nitrogen: Yes   Region frozen until ice ball extended beyond lesion: Yes   Cryo cycles: 1 or 2. Outcome: patient tolerated procedure well with no complications   Post-procedure details: wound care instructions given   Additional details:  Prior to procedure, discussed risks of blister formation, small wound, skin dyspigmentation, or rare scar following cryotherapy. Recommend Vaseline ointment to treated areas while healing.   ACTINIC ELASTOSIS   HYPERTROPHIC SCAR   HISTORY OF SQUAMOUS CELL CARCINOMA IN SITU (SCCIS)    Return for Follow Up As Scheduled.  I, Jill Parcell, CMA, am acting as scribe for Boneta Sharps, MD.   Documentation: I have reviewed the above documentation for accuracy and completeness, and I agree with the above.  Boneta Sharps, MD

## 2023-12-14 NOTE — Patient Instructions (Addendum)
 Cryotherapy Aftercare  Wash gently with soap and water everyday.   Apply Vaseline and Band-Aid daily until healed.    Recommend daily broad spectrum sunscreen SPF 30+ to sun-exposed areas, reapply every 2 hours as needed. Call for new or changing lesions.  Staying in the shade or wearing long sleeves, sun glasses (UVA+UVB protection) and wide brim hats (4-inch brim around the entire circumference of the hat) are also recommended for sun protection.     Due to recent changes in healthcare laws, you may see results of your pathology and/or laboratory studies on MyChart before the doctors have had a chance to review them. We understand that in some cases there may be results that are confusing or concerning to you. Please understand that not all results are received at the same time and often the doctors may need to interpret multiple results in order to provide you with the best plan of care or course of treatment. Therefore, we ask that you please give us  2 business days to thoroughly review all your results before contacting the office for clarification. Should we see a critical lab result, you will be contacted sooner.   If You Need Anything After Your Visit  If you have any questions or concerns for your doctor, please call our main line at [phone] and press option 4 to reach your doctor's medical assistant. If no one answers, please leave a voicemail as directed and we will return your call as soon as possible. Messages left after 4 pm will be answered the following business day.   You may also send us  a message via MyChart. We typically respond to MyChart messages within 1-2 business days.  For prescription refills, please ask your pharmacy to contact our office. Our fax number is [phone].  If you have an urgent issue when the clinic is closed that cannot wait until the next business day, you can page your doctor at the number below.    Please note that while we do our best to  be available for urgent issues outside of office hours, we are not available 24/7.   If you have an urgent issue and are unable to reach us , you may choose to seek medical care at your doctor's office, retail clinic, urgent care center, or emergency room.  If you have a medical emergency, please immediately call 911 or go to the emergency department.  Pager Numbers  - Dr. Hester: [phone]  - Dr. Jackquline: [phone]  - Dr. Claudene: [phone]   - Dr. Raymund: [phone]  In the event of inclement weather, please call our main line at [phone] for an update on the status of any delays or closures.  Dermatology Medication Tips: Please keep the boxes that topical medications come in in order to help keep track of the instructions about where and how to use these. Pharmacies typically print the medication instructions only on the boxes and not directly on the medication tubes.   If your medication is too expensive, please contact our office at [phone] option 4 or send us  a message through MyChart.   We are unable to tell what your co-pay for medications will be in advance as this is different depending on your insurance coverage. However, we may be able to find a substitute medication at lower cost or fill out paperwork to get insurance to cover a needed medication.   If a prior authorization is required to get your medication covered by your insurance company, please allow us  1-2  business days to complete this process.  Drug prices often vary depending on where the prescription is filled and some pharmacies may offer cheaper prices.  The website www.goodrx.com contains coupons for medications through different pharmacies. The prices here do not account for what the cost may be with help from insurance (it may be cheaper with your insurance), but the website can give you the price if you did not use any insurance.  - You can print the associated coupon and take it with your  prescription to the pharmacy.  - You may also stop by our office during regular business hours and pick up a GoodRx coupon card.  - If you need your prescription sent electronically to a different pharmacy, notify our office through [facility] or by phone at [phone] option 4.     Si Usted Necesita Algo Despus de Su Visita  Tambin puede enviarnos un mensaje a travs de Clinical cytogeneticist. Por lo general respondemos a los mensajes de MyChart en el transcurso de 1 a 2 das hbiles.  Para renovar recetas, por favor pida a su farmacia que se ponga en contacto con nuestra oficina. Randi lakes de fax es [place] [phone].  Si tiene un asunto urgente cuando la clnica est cerrada y que no puede esperar hasta el siguiente da hbil, puede llamar/localizar a su doctor(a) al nmero que aparece a continuacin.   Por favor, tenga en cuenta que aunque hacemos todo lo posible para estar disponibles para asuntos urgentes fuera del horario de [place], no estamos disponibles las 24 horas del da, los 7 [address]  [address] de la [place].   Si tiene un problema urgente y no puede comunicarse con nosotros, puede optar por buscar atencin mdica  en el consultorio de su doctor(a), en una clnica privada, en un centro de atencin urgente o en una sala de emergencias.  Si tiene Engineer, drilling, por favor llame inmediatamente al 911 o vaya a la sala de emergencias.  Nmeros de bper  - Dr. Hester: [phone]  - Dra. Jackquline: [phone]  - Dr. Claudene: [phone]  - Dra. Kitts: [phone]  En caso de inclemencias del [place], por favor llame a nuestra lnea principal al [phone] para una actualizacin sobre el estado de cualquier retraso o cierre.  Consejos para la medicacin en dermatologa: Por favor, guarde las cajas en las que vienen los medicamentos de uso tpico para ayudarle a seguir las instrucciones sobre dnde y cmo usarlos. Las farmacias generalmente imprimen las instrucciones del  medicamento slo en las cajas y no directamente en los tubos del [place].   Si su medicamento es muy caro, por favor, pngase en contacto con landry rieger llamando al [phone] y presione la opcin 4 o envenos un mensaje a travs de Clinical cytogeneticist.   No podemos decirle cul ser su copago por los medicamentos por adelantado ya que esto es diferente dependiendo de la cobertura de su seguro. Sin embargo, es posible que podamos encontrar un medicamento sustituto a Audiological scientist un formulario para que el seguro cubra el medicamento que se considera necesario.   Si se requiere una autorizacin previa para que su compaa de seguros malta su medicamento, por favor permtanos de 1 a 2 das hbiles para completar este proceso.  Los precios de los medicamentos varan con frecuencia dependiendo del Environmental consultant de dnde se surte la receta y alguna farmacias pueden ofrecer precios ms baratos.  El sitio web www.goodrx.com tiene cupones para medicamentos de Health and safety inspector. Los precios aqu no tienen en cuenta  lo que podra costar con la ayuda del seguro (puede ser ms barato con su seguro), pero el sitio web puede darle el precio si no Visual merchandiser.  - Puede imprimir el cupn correspondiente y llevarlo con su receta a la farmacia.  - Tambin puede pasar por nuestra oficina durante el horario de atencin regular y Education officer, museum una tarjeta de cupones de GoodRx.  - Si necesita que su receta se enve electrnicamente a una farmacia diferente, informe a nuestra oficina a travs de MyChart de [place] o por telfono llamando al [phone] y presione la opcin 4.

## 2024-01-25 ENCOUNTER — Other Ambulatory Visit: Payer: Self-pay

## 2024-01-25 DIAGNOSIS — C61 Malignant neoplasm of prostate: Secondary | ICD-10-CM

## 2024-01-26 ENCOUNTER — Other Ambulatory Visit: Payer: Self-pay

## 2024-01-29 ENCOUNTER — Other Ambulatory Visit

## 2024-01-29 DIAGNOSIS — C61 Malignant neoplasm of prostate: Secondary | ICD-10-CM

## 2024-01-30 LAB — PSA: Prostate Specific Ag, Serum: 0.2 ng/mL (ref 0.0–4.0)

## 2024-01-31 ENCOUNTER — Ambulatory Visit (INDEPENDENT_AMBULATORY_CARE_PROVIDER_SITE_OTHER): Admitting: Urology

## 2024-01-31 VITALS — BP 152/82 | HR 117 | Ht 67.0 in | Wt 212.0 lb

## 2024-01-31 DIAGNOSIS — N393 Stress incontinence (female) (male): Secondary | ICD-10-CM

## 2024-01-31 DIAGNOSIS — C61 Malignant neoplasm of prostate: Secondary | ICD-10-CM

## 2024-01-31 MED ORDER — BICALUTAMIDE 50 MG PO TABS: 50.0000 mg | ORAL_TABLET | Freq: Every day | ORAL | 3 refills | Status: AC

## 2024-01-31 MED ORDER — FINASTERIDE 5 MG PO TABS: 5.0000 mg | ORAL_TABLET | Freq: Every day | ORAL | 3 refills | Status: AC

## 2024-01-31 NOTE — Progress Notes (Signed)
   01/31/2024 11:10 AM   Michael Cantrell 02/18/1951 [id number]  Reason for visit: Follow up prostate cancer, incontinence, atrophic kidney  History: Previously followed long-term by Dr. Matilda, establish care with [facility] urology February 2023 History of perineal prostatectomy in 2000 for prostate cancer, radiation, with biochemical recurrence, grade and stage unknown He has been on Casodex  and finasteride  long-term by Dr. Matilda which has kept PSA low.  Reported history of PSA jumping to 18 when those medications were stopped previously History of infections Atrophic nonfunctioning right kidney of unclear etiology  Physical Exam: BP (!) 152/82 (BP Location: Left Arm, Patient Position: Sitting, Cuff Size: Normal)   Pulse (!) 117   Ht 5' 7 (1.702 m)   Wt 212 lb (96.2 kg)   SpO2 95%   BMI 33.20 kg/m   Imaging/labs: PSA November 2025 0.2, stable from 0.3 last year  Today: Denies any major changes over the last year No UTIs in the last year, no gross hematuria or dysuria Stable stress incontinence, he is not interested in other treatment options  Plan:   Prostate cancer: Previously treated with radical prostatectomy and radiation, PSA has remained very low on finasteride  and Casodex  that was started by Dr. Matilda and he would like to continue those medications.  Continue yearly PSA monitoring. Stress incontinence: Declines external clamp or referral to care discussed AUS RTC 1 year PSA prior   Redell JAYSON Burnet, MD  [facility] [address] [place], [place] [address] [phone]

## 2024-02-01 ENCOUNTER — Ambulatory Visit: Payer: Medicare Other | Admitting: Urology

## 2024-02-07 ENCOUNTER — Ambulatory Visit: Admitting: Urology

## 2024-02-08 ENCOUNTER — Ambulatory Visit: Payer: Self-pay | Admitting: Urology

## 2024-02-28 ENCOUNTER — Ambulatory Visit: Admitting: Dermatology

## 2024-03-04 ENCOUNTER — Ambulatory Visit: Admitting: Dermatology

## 2024-03-19 ENCOUNTER — Encounter: Payer: Self-pay | Admitting: Dermatology

## 2024-03-19 ENCOUNTER — Ambulatory Visit (INDEPENDENT_AMBULATORY_CARE_PROVIDER_SITE_OTHER): Admitting: Dermatology

## 2024-03-19 DIAGNOSIS — D485 Neoplasm of uncertain behavior of skin: Secondary | ICD-10-CM | POA: Diagnosis not present

## 2024-03-19 DIAGNOSIS — Z1283 Encounter for screening for malignant neoplasm of skin: Secondary | ICD-10-CM

## 2024-03-19 DIAGNOSIS — D229 Melanocytic nevi, unspecified: Secondary | ICD-10-CM

## 2024-03-19 DIAGNOSIS — L578 Other skin changes due to chronic exposure to nonionizing radiation: Secondary | ICD-10-CM | POA: Diagnosis not present

## 2024-03-19 DIAGNOSIS — L570 Actinic keratosis: Secondary | ICD-10-CM | POA: Diagnosis not present

## 2024-03-19 DIAGNOSIS — L821 Other seborrheic keratosis: Secondary | ICD-10-CM | POA: Diagnosis not present

## 2024-03-19 DIAGNOSIS — D044 Carcinoma in situ of skin of scalp and neck: Secondary | ICD-10-CM

## 2024-03-19 DIAGNOSIS — D1801 Hemangioma of skin and subcutaneous tissue: Secondary | ICD-10-CM

## 2024-03-19 DIAGNOSIS — C44229 Squamous cell carcinoma of skin of left ear and external auricular canal: Secondary | ICD-10-CM

## 2024-03-19 DIAGNOSIS — C4492 Squamous cell carcinoma of skin, unspecified: Secondary | ICD-10-CM

## 2024-03-19 DIAGNOSIS — C44629 Squamous cell carcinoma of skin of left upper limb, including shoulder: Secondary | ICD-10-CM | POA: Diagnosis not present

## 2024-03-19 DIAGNOSIS — W908XXA Exposure to other nonionizing radiation, initial encounter: Secondary | ICD-10-CM

## 2024-03-19 DIAGNOSIS — L853 Xerosis cutis: Secondary | ICD-10-CM

## 2024-03-19 DIAGNOSIS — L814 Other melanin hyperpigmentation: Secondary | ICD-10-CM

## 2024-03-19 DIAGNOSIS — D099 Carcinoma in situ, unspecified: Secondary | ICD-10-CM

## 2024-03-19 HISTORY — DX: Squamous cell carcinoma of skin, unspecified: C44.92

## 2024-03-19 NOTE — Patient Instructions (Signed)
 Biopsy Wound Care Instructions  Leave the original bandage on for 24 hours if possible.  If the bandage becomes soaked or soiled before that time, it is OK to remove it and examine the wound.  A small amount of post-operative bleeding is normal.  If excessive bleeding occurs, remove the bandage, place gauze over the site and apply continuous pressure (no peeking) over the area for 30 minutes. If this does not work, please call our clinic as soon as possible or page your doctor if it is after hours.   Once a day, cleanse the wound with soap and water. It is fine to shower. If a thick crust develops you may use a Q-tip dipped into dilute hydrogen peroxide (mix 1:1 with water) to dissolve it.  Hydrogen peroxide can slow the healing process, so use it only as needed.    After washing, apply petroleum jelly (Vaseline) or an antibiotic ointment if your doctor prescribed one for you, followed by a bandage.    For best healing, the wound should be covered with a layer of ointment at all times. If you are not able to keep the area covered with a bandage to hold the ointment in place, this may mean re-applying the ointment several times a day.  Continue this wound care until the wound has healed and is no longer open.   Itching and mild discomfort is normal during the healing process. However, if you develop pain or severe itching, please call our office.   If you have any discomfort, you can take Tylenol (acetaminophen) or ibuprofen as directed on the bottle. (Please do not take these if you have an allergy to them or cannot take them for another reason).  Some redness, tenderness and white or yellow material in the wound is normal healing.  If the area becomes very sore and red, or develops a thick yellow-green material (pus), it may be infected; please notify us .    If you have stitches, return to clinic as directed to have the stitches removed. You will continue wound care for 2-3 days after the stitches  are removed.   Wound healing continues for up to one year following surgery. It is not unusual to experience pain in the scar from time to time during the interval.  If the pain becomes severe or the scar thickens, you should notify the office.    A slight amount of redness in a scar is expected for the first six months.  After six months, the redness will fade and the scar will soften and fade.  The color difference becomes less noticeable with time.  If there are any problems, return for a post-op surgery check at your earliest convenience.  To improve the appearance of the scar, you can use silicone scar gel, cream, or sheets (such as Mederma or Serica) every night for up to one year. These are available over the counter (without a prescription).  Please call our office at [phone] for any questions or concerns.    Melanoma ABCDEs  Melanoma is the most dangerous type of skin cancer, and is the leading cause of death from skin disease.  You are more likely to develop melanoma if you: Have light-colored skin, light-colored eyes, or red or blond hair Spend a lot of time in the sun Tan regularly, either outdoors or in a tanning bed Have had blistering sunburns, especially during childhood Have a close family member who has had a melanoma Have atypical moles or large birthmarks  Early detection of melanoma is key since treatment is typically straightforward and cure rates are extremely high if we catch it early.   The first sign of melanoma is often a change in a mole or a new dark spot.  The ABCDE system is a way of remembering the signs of melanoma.  A for asymmetry:  The two halves do not match. B for border:  The edges of the growth are irregular. C for color:  A mixture of colors are present instead of an even brown color. D for diameter:  Melanomas are usually (but not always) greater than 6mm - the size of a pencil eraser. E for evolution:  The spot keeps changing in size,  shape, and color.  Please check your skin once per month between visits. You can use a small mirror in front and a large mirror behind you to keep an eye on the back side or your body.   If you see any new or changing lesions before your next follow-up, please call to schedule a visit.  Please continue daily skin protection including broad spectrum sunscreen SPF 30+ to sun-exposed areas, reapplying every 2 hours as needed when you're outdoors.    Due to recent changes in healthcare laws, you may see results of your pathology and/or laboratory studies on MyChart before the doctors have had a chance to review them. We understand that in some cases there may be results that are confusing or concerning to you. Please understand that not all results are received at the same time and often the doctors may need to interpret multiple results in order to provide you with the best plan of care or course of treatment. Therefore, we ask that you please give us  2 business days to thoroughly review all your results before contacting the office for clarification. Should we see a critical lab result, you will be contacted sooner.   If You Need Anything After Your Visit  If you have any questions or concerns for your doctor, please call our main line at [phone] and press option 4 to reach your doctor's medical assistant. If no one answers, please leave a voicemail as directed and we will return your call as soon as possible. Messages left after 4 pm will be answered the following business day.   You may also send us  a message via MyChart. We typically respond to MyChart messages within 1-2 business days.  For prescription refills, please ask your pharmacy to contact our office. Our fax number is [phone].  If you have an urgent issue when the clinic is closed that cannot wait until the next business day, you can page your doctor at the number below.    Please note that while we do our best to be  available for urgent issues outside of office hours, we are not available 24/7.   If you have an urgent issue and are unable to reach us , you may choose to seek medical care at your doctor's office, retail clinic, urgent care center, or emergency room.  If you have a medical emergency, please immediately call 911 or go to the emergency department.  Pager Numbers  - Dr. Hester: [phone]  - Dr. Jackquline: [phone]  - Dr. Claudene: [phone]   - Dr. Raymund: [phone]  In the event of inclement weather, please call our main line at [phone] for an update on the status of any delays or closures.  Dermatology Medication Tips: Please keep the boxes that topical medications come  in in order to help keep track of the instructions about where and how to use these. Pharmacies typically print the medication instructions only on the boxes and not directly on the medication tubes.   If your medication is too expensive, please contact our office at [phone] option 4 or send us  a message through MyChart.   We are unable to tell what your co-pay for medications will be in advance as this is different depending on your insurance coverage. However, we may be able to find a substitute medication at lower cost or fill out paperwork to get insurance to cover a needed medication.   If a prior authorization is required to get your medication covered by your insurance company, please allow us  1-2 business days to complete this process.  Drug prices often vary depending on where the prescription is filled and some pharmacies may offer cheaper prices.  The website www.goodrx.com contains coupons for medications through different pharmacies. The prices here do not account for what the cost may be with help from insurance (it may be cheaper with your insurance), but the website can give you the price if you did not use any insurance.  - You can print the associated coupon and take it with your  prescription to the pharmacy.  - You may also stop by our office during regular business hours and pick up a GoodRx coupon card.  - If you need your prescription sent electronically to a different pharmacy, notify our office through [facility] or by phone at [phone] option 4.     Si Usted Necesita Algo Despus de Su Visita  Tambin puede enviarnos un mensaje a travs de Clinical Cytogeneticist. Por lo general respondemos a los mensajes de MyChart en el transcurso de 1 a 2 das hbiles.  Para renovar recetas, por favor pida a su farmacia que se ponga en contacto con nuestra oficina. Randi lakes de fax es [place] [phone].  Si tiene un asunto urgente cuando la clnica est cerrada y que no puede esperar hasta el siguiente da hbil, puede llamar/localizar a su doctor(a) al nmero que aparece a continuacin.   Por favor, tenga en cuenta que aunque hacemos todo lo posible para estar disponibles para asuntos urgentes fuera del horario de [place], no estamos disponibles las 24 horas del da, los 7 [address]  [address] de la [place].   Si tiene un problema urgente y no puede comunicarse con nosotros, puede optar por buscar atencin mdica  en el consultorio de su doctor(a), en una clnica privada, en un centro de atencin urgente o en una sala de emergencias.  Si tiene engineer, drilling, por favor llame inmediatamente al 911 o vaya a la sala de emergencias.  Nmeros de bper  - Dr. Hester: [phone]  - Dra. Jackquline: [phone]  - Dr. Claudene: [phone]  - Dra. Kitts: [phone]  En caso de inclemencias del [place], por favor llame a nuestra lnea principal al [phone] para una actualizacin sobre el estado de cualquier retraso o cierre.  Consejos para la medicacin en dermatologa: Por favor, guarde las cajas en las que vienen los medicamentos de uso tpico para ayudarle a seguir las instrucciones sobre dnde y cmo usarlos. Las farmacias generalmente imprimen las instrucciones del  medicamento slo en las cajas y no directamente en los tubos del [place].   Si su medicamento es muy caro, por favor, pngase en contacto con landry rieger llamando al [phone] y presione la opcin 4 o envenos un mensaje a travs de Clinical Cytogeneticist.  No podemos decirle cul ser su copago por los medicamentos por adelantado ya que esto es diferente dependiendo de la cobertura de su seguro. Sin embargo, es posible que podamos encontrar un medicamento sustituto a audiological scientist un formulario para que el seguro cubra el medicamento que se considera necesario.   Si se requiere una autorizacin previa para que su compaa de seguros cubra su medicamento, por favor permtanos de 1 a 2 das hbiles para completar este proceso.  Los precios de los medicamentos varan con frecuencia dependiendo del environmental consultant de dnde se surte la receta y alguna farmacias pueden ofrecer precios ms baratos.  El sitio web www.goodrx.com tiene cupones para medicamentos de health and safety inspector. Los precios aqu no tienen en cuenta lo que podra costar con la ayuda del seguro (puede ser ms barato con su seguro), pero el sitio web puede darle el precio si no utiliz tourist information centre manager.  - Puede imprimir el cupn correspondiente y llevarlo con su receta a la farmacia.  - Tambin puede pasar por nuestra oficina durante el horario de atencin regular y education officer, museum una tarjeta de cupones de GoodRx.  - Si necesita que su receta se enve electrnicamente a una farmacia diferente, informe a nuestra oficina a travs de MyChart de [place] o por telfono llamando al [phone] y presione la opcin 4.

## 2024-03-19 NOTE — Progress Notes (Signed)
 "  Follow-Up Visit   Subjective  Michael Cantrell is a 73 y.o. male who presents for the following: Skin Cancer Screening and Full Body Skin Exam  The patient presents for Total-Body Skin Exam (TBSE) for skin cancer screening and mole check. The patient has spots, moles and lesions to be evaluated, some may be new or changing and the patient may have concern these could be cancer.  Hx SCC. Place at left forearm, scratched it off about 3 weeks ago but comes back. Patient with 2 bx proven SCCis at vertex and medial scalp that persists after partial treatment with 5FU. Plan EDC today.  The following portions of the chart were reviewed this encounter and updated as appropriate: medications, allergies, medical history  Review of Systems:  No other skin or systemic complaints except as noted in HPI or Assessment and Plan.  Objective  Well appearing patient in no apparent distress; mood and affect are within normal limits.  A full examination was performed including scalp, head, eyes, ears, nose, lips, neck, chest, axillae, abdomen, back, buttocks, bilateral upper extremities, bilateral lower extremities, hands, feet, fingers, toes, fingernails, and toenails. All findings within normal limits unless otherwise noted below.   Relevant physical exam findings are noted in the Assessment and Plan.  vertex scalp Pink bx site medial scalp Pink bx site left proximal lateral forearm 1.2 cm hyperkeratotic plaque  right distal lateral forearm 7 mm hyperkeratotic papule  left antihelix 1.1 x 1.0 cm scaly plaque   Assessment & Plan   SKIN CANCER SCREENING PERFORMED TODAY.  ACTINIC DAMAGE - Chronic condition, secondary to cumulative UV/sun exposure - diffuse scaly erythematous macules with underlying dyspigmentation - Recommend daily broad spectrum sunscreen SPF 30+ to sun-exposed areas, reapply every 2 hours as needed.  - Staying in the shade or wearing long sleeves, sun glasses (UVA+UVB  protection) and wide brim hats (4-inch brim around the entire circumference of the hat) are also recommended for sun protection.  - Call for new or changing lesions.  LENTIGINES, SEBORRHEIC KERATOSES, HEMANGIOMAS - Benign normal skin lesions - Benign-appearing - Call for any changes  MELANOCYTIC NEVI - Tan-brown and/or pink-flesh-colored symmetric macules and papules - Benign appearing on exam today - Observation - Call clinic for new or changing moles - Recommend daily use of broad spectrum spf 30+ sunscreen to sun-exposed areas.   HISTORY OF SQUAMOUS CELL CARCINOMA OF THE SKIN - No evidence of recurrence today - No lymphadenopathy - Recommend regular full body skin exams - Recommend daily broad spectrum sunscreen SPF 30+ to sun-exposed areas, reapply every 2 hours as needed.  - Call if any new or changing lesions are noted between office visits  ACTINIC DAMAGE WITH PRECANCEROUS ACTINIC KERATOSES Counseling for Topical Chemotherapy Management: Patient exhibits: - Severe, confluent actinic changes with pre-cancerous actinic keratoses that is secondary to cumulative UV radiation exposure over time on scalp face forearms hands - Condition that is severe; chronic, not at goal. - diffuse scaly erythematous macules and papules with underlying dyspigmentation - Discussed Prescription Field Treatment topical Chemotherapy for Severe, Chronic Confluent Actinic Changes with Pre-Cancerous Actinic Keratoses Field treatment involves treatment of an entire area of skin that has confluent Actinic Changes (Sun/ Ultraviolet light damage) and PreCancerous Actinic Keratoses by method of PhotoDynamic Therapy (PDT) and/or prescription Topical Chemotherapy agents such as 5-fluorouracil , 5-fluorouracil /calcipotriene, and/or imiquimod.  The purpose is to decrease the number of clinically evident and subclinical PreCancerous lesions to prevent progression to development of skin cancer by chemically destroying  early precancer changes that may or may not be visible.  It has been shown to reduce the risk of developing skin cancer in the treated area. As a result of treatment, redness, scaling, crusting, and open sores may occur during treatment course. One or more than one of these methods may be used and may have to be used several times to control, suppress and eliminate the PreCancerous changes. Discussed treatment course, expected reaction, and possible side effects. - Recommend daily broad spectrum sunscreen SPF 30+ to sun-exposed areas, reapply every 2 hours as needed.  - Staying in the shade or wearing long sleeves, sun glasses (UVA+UVB protection) and wide brim hats (4-inch brim around the entire circumference of the hat) are also recommended. - Call for new or changing lesions. - patient defers treatment today SQUAMOUS CELL CARCINOMA IN SITU (2) vertex scalp - Destruction of lesion Complexity: simple   Destruction method: electrodesiccation and curettage   Informed consent: discussed and consent obtained   Timeout:  patient name, date of birth, surgical site, and procedure verified Procedure prep:  Patient was prepped and draped in usual sterile fashion Prep type:  Isopropyl alcohol Anesthesia: the lesion was anesthetized in a standard fashion   Anesthetic:  1% lidocaine  w/ epinephrine  1-100,000 local infiltration Curettage performed in three different directions: Yes   Electrodesiccation performed over the curetted area: Yes   Curettage cycles:  3 Final wound size (cm):  1.8 Hemostasis achieved with:  aluminum chloride and electrodesiccation Outcome: patient tolerated procedure well with no complications   Post-procedure details: sterile dressing applied and wound care instructions given   Dressing type: bandage and petrolatum    medial scalp - Destruction of lesion Complexity: simple   Destruction method: electrodesiccation and curettage   Informed consent: discussed and consent  obtained   Timeout:  patient name, date of birth, surgical site, and procedure verified Procedure prep:  Patient was prepped and draped in usual sterile fashion Prep type:  Isopropyl alcohol Anesthesia: the lesion was anesthetized in a standard fashion   Anesthetic:  1% lidocaine  w/ epinephrine  1-100,000 local infiltration Curettage performed in three different directions: Yes   Electrodesiccation performed over the curetted area: Yes   Curettage cycles:  3 Final wound size (cm):  1.4 Hemostasis achieved with:  aluminum chloride and electrodesiccation Outcome: patient tolerated procedure well with no complications   Post-procedure details: sterile dressing applied and wound care instructions given   Dressing type: bandage and petrolatum    NEOPLASM OF UNCERTAIN BEHAVIOR OF SKIN (3) left proximal lateral forearm - Epidermal / dermal shaving  Lesion diameter (cm):  1.2 Informed consent: discussed and consent obtained   Timeout: patient name, date of birth, surgical site, and procedure verified   Procedure prep:  Patient was prepped and draped in usual sterile fashion Prep type:  Isopropyl alcohol Anesthesia: the lesion was anesthetized in a standard fashion   Anesthetic:  1% lidocaine  w/ epinephrine  1-100,000 buffered w/ 8.4% NaHCO3 Instrument used: DermaBlade   Hemostasis achieved with: pressure and aluminum chloride   Outcome: patient tolerated procedure well   Post-procedure details: wound care instructions given    Specimen 1 - Surgical pathology Differential Diagnosis: SCC  Check Margins: No right distal lateral forearm - Epidermal / dermal shaving  Lesion diameter (cm):  0.7 Informed consent: discussed and consent obtained   Timeout: patient name, date of birth, surgical site, and procedure verified   Procedure prep:  Patient was prepped and draped in usual sterile fashion Prep type:  Isopropyl alcohol Anesthesia: the lesion was anesthetized in a standard fashion    Anesthetic:  1% lidocaine  w/ epinephrine  1-100,000 buffered w/ 8.4% NaHCO3 Instrument used: DermaBlade   Hemostasis achieved with: pressure and aluminum chloride   Outcome: patient tolerated procedure well   Post-procedure details: wound care instructions given    Specimen 2 - Surgical pathology Differential Diagnosis: SCC  Check Margins: No left antihelix - Epidermal / dermal shaving  Lesion diameter (cm):  1.1 Informed consent: discussed and consent obtained   Timeout: patient name, date of birth, surgical site, and procedure verified   Procedure prep:  Patient was prepped and draped in usual sterile fashion Prep type:  Isopropyl alcohol Anesthesia: the lesion was anesthetized in a standard fashion   Anesthetic:  1% lidocaine  w/ epinephrine  1-100,000 buffered w/ 8.4% NaHCO3 Instrument used: DermaBlade   Hemostasis achieved with: pressure and aluminum chloride   Outcome: patient tolerated procedure well   Post-procedure details: wound care instructions given    Specimen 3 - Surgical pathology Differential Diagnosis: SCC  Check Margins: No MULTIPLE BENIGN NEVI   LENTIGINES   ACTINIC KERATOSES   ACTINIC ELASTOSIS   SEBORRHEIC KERATOSES   CHERRY ANGIOMA   XEROSIS CUTIS   Return in about 3 months (around 06/17/2024) for TBSE, with Dr. Claudene, HxSCC.  LILLETTE Lonell Drones, RMA, am acting as scribe for Boneta Claudene, MD .   Documentation: I have reviewed the above documentation for accuracy and completeness, and I agree with the above.  Boneta Claudene, MD   "

## 2024-03-25 ENCOUNTER — Ambulatory Visit: Payer: Self-pay | Admitting: Dermatology

## 2024-03-25 DIAGNOSIS — C4492 Squamous cell carcinoma of skin, unspecified: Secondary | ICD-10-CM

## 2024-03-25 LAB — SURGICAL PATHOLOGY

## 2024-04-01 ENCOUNTER — Encounter: Payer: Self-pay | Admitting: Dermatology

## 2024-04-01 NOTE — Telephone Encounter (Signed)
 Spoke with patient and advised of biopsy results. Referral placed to Dr. Bluford for mohs surgery and excision with Dr. Claudene scheduled for 05/29/24.

## 2024-04-01 NOTE — Telephone Encounter (Signed)
-----   Message from Boneta Sharps, MD sent at 03/25/2024  9:51 PM EST ----- Diagnosis: 1. Skin, left proximal lateral forearm :       WELL DIFFERENTIATED SQUAMOUS CELL CARCINOMA        2. Skin, right distal lateral forearm :       SEBORRHEIC KERATOSIS, IRRITATED        3. Skin, left antihelix :       WELL DIFFERENTIATED SQUAMOUS CELL CARCINOMA, CYSTIC PATTERN   Please call   L forearm, invasive SCC, excision R forearm, ISK, no treatment L ear, invasive SCC, Mohs with Dr Bluford (pt established with Dr Bluford)

## 2024-05-29 ENCOUNTER — Encounter: Admitting: Dermatology

## 2024-06-25 ENCOUNTER — Ambulatory Visit: Admitting: Dermatology

## 2024-12-09 ENCOUNTER — Ambulatory Visit (INDEPENDENT_AMBULATORY_CARE_PROVIDER_SITE_OTHER): Payer: PRIVATE HEALTH INSURANCE | Admitting: Vascular Surgery

## 2024-12-09 ENCOUNTER — Encounter (INDEPENDENT_AMBULATORY_CARE_PROVIDER_SITE_OTHER)

## 2025-01-30 ENCOUNTER — Other Ambulatory Visit

## 2025-02-05 ENCOUNTER — Ambulatory Visit: Admitting: Urology
